# Patient Record
Sex: Female | Born: 1975 | Race: Black or African American | Hispanic: No | Marital: Married | Smoking: Former smoker
Health system: Southern US, Community
[De-identification: ages and names within clinical notes are randomized; demographics above are authoritative.]

## PROBLEM LIST (undated history)

## (undated) DIAGNOSIS — D219 Benign neoplasm of connective and other soft tissue, unspecified: Secondary | ICD-10-CM

## (undated) DIAGNOSIS — N946 Dysmenorrhea, unspecified: Secondary | ICD-10-CM

## (undated) DIAGNOSIS — E049 Nontoxic goiter, unspecified: Secondary | ICD-10-CM

## (undated) DIAGNOSIS — F419 Anxiety disorder, unspecified: Secondary | ICD-10-CM

## (undated) DIAGNOSIS — E349 Endocrine disorder, unspecified: Secondary | ICD-10-CM

## (undated) DIAGNOSIS — C73 Malignant neoplasm of thyroid gland: Secondary | ICD-10-CM

## (undated) DIAGNOSIS — F329 Major depressive disorder, single episode, unspecified: Secondary | ICD-10-CM

## (undated) DIAGNOSIS — N809 Endometriosis, unspecified: Secondary | ICD-10-CM

## (undated) HISTORY — DX: Anxiety disorder, unspecified: F41.9

## (undated) HISTORY — PX: THYROIDECTOMY: SHX17

## (undated) HISTORY — DX: Nontoxic goiter, unspecified: E04.9

## (undated) HISTORY — DX: Endometriosis, unspecified: N80.9

## (undated) HISTORY — DX: Malignant neoplasm of thyroid gland: C73

## (undated) HISTORY — DX: Endocrine disorder, unspecified: E34.9

## (undated) HISTORY — DX: Dysmenorrhea, unspecified: N94.6

## (undated) HISTORY — DX: Major depressive disorder, single episode, unspecified: F32.9

## (undated) HISTORY — DX: Benign neoplasm of connective and other soft tissue, unspecified: D21.9

---
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
## 1998-06-23 ENCOUNTER — Other Ambulatory Visit: Admission: RE | Admit: 1998-06-23 | Discharge: 1998-06-23 | Payer: Self-pay | Admitting: Obstetrics and Gynecology

## 1998-12-25 ENCOUNTER — Encounter: Admission: RE | Admit: 1998-12-25 | Discharge: 1998-12-25 | Payer: Self-pay | Admitting: Obstetrics and Gynecology

## 1999-03-15 HISTORY — PX: TUBAL LIGATION: SHX77

## 1999-03-22 ENCOUNTER — Ambulatory Visit (HOSPITAL_COMMUNITY): Admission: RE | Admit: 1999-03-22 | Discharge: 1999-03-22 | Payer: Self-pay | Admitting: Obstetrics and Gynecology

## 1999-03-22 ENCOUNTER — Encounter: Payer: Self-pay | Admitting: Obstetrics and Gynecology

## 1999-04-19 ENCOUNTER — Ambulatory Visit (HOSPITAL_COMMUNITY): Admission: RE | Admit: 1999-04-19 | Discharge: 1999-04-19 | Payer: Self-pay | Admitting: Psychology

## 1999-04-19 ENCOUNTER — Encounter: Payer: Self-pay | Admitting: Obstetrics and Gynecology

## 1999-05-13 ENCOUNTER — Other Ambulatory Visit: Admission: RE | Admit: 1999-05-13 | Discharge: 1999-05-13 | Payer: Self-pay | Admitting: Obstetrics and Gynecology

## 1999-08-13 ENCOUNTER — Encounter (INDEPENDENT_AMBULATORY_CARE_PROVIDER_SITE_OTHER): Payer: Self-pay | Admitting: Specialist

## 1999-08-13 ENCOUNTER — Inpatient Hospital Stay (HOSPITAL_COMMUNITY): Admission: AD | Admit: 1999-08-13 | Discharge: 1999-08-15 | Payer: Self-pay | Admitting: Obstetrics and Gynecology

## 2002-05-17 ENCOUNTER — Other Ambulatory Visit: Admission: RE | Admit: 2002-05-17 | Discharge: 2002-05-17 | Payer: Self-pay | Admitting: Obstetrics and Gynecology

## 2004-12-01 ENCOUNTER — Other Ambulatory Visit: Admission: RE | Admit: 2004-12-01 | Discharge: 2004-12-01 | Payer: Self-pay | Admitting: Obstetrics and Gynecology

## 2006-03-14 DIAGNOSIS — D219 Benign neoplasm of connective and other soft tissue, unspecified: Secondary | ICD-10-CM

## 2006-03-14 HISTORY — DX: Benign neoplasm of connective and other soft tissue, unspecified: D21.9

## 2006-07-25 ENCOUNTER — Ambulatory Visit: Payer: Self-pay | Admitting: Internal Medicine

## 2006-07-27 ENCOUNTER — Ambulatory Visit: Payer: Self-pay | Admitting: Internal Medicine

## 2006-08-04 ENCOUNTER — Ambulatory Visit: Payer: Self-pay | Admitting: Internal Medicine

## 2007-03-15 HISTORY — PX: ABLATION: SHX5711

## 2007-07-09 ENCOUNTER — Emergency Department (HOSPITAL_COMMUNITY): Admission: EM | Admit: 2007-07-09 | Discharge: 2007-07-09 | Payer: Self-pay | Admitting: Emergency Medicine

## 2007-07-11 ENCOUNTER — Ambulatory Visit: Payer: Self-pay | Admitting: Internal Medicine

## 2007-07-11 DIAGNOSIS — R519 Headache, unspecified: Secondary | ICD-10-CM | POA: Insufficient documentation

## 2007-07-11 DIAGNOSIS — R51 Headache: Secondary | ICD-10-CM | POA: Insufficient documentation

## 2007-11-02 ENCOUNTER — Ambulatory Visit: Payer: Self-pay | Admitting: Internal Medicine

## 2009-04-30 ENCOUNTER — Ambulatory Visit (HOSPITAL_COMMUNITY): Admission: RE | Admit: 2009-04-30 | Discharge: 2009-04-30 | Payer: Self-pay | Admitting: Obstetrics and Gynecology

## 2010-06-04 LAB — CBC
HCT: 38.4 % (ref 36.0–46.0)
Hemoglobin: 12.5 g/dL (ref 12.0–15.0)
MCHC: 32.6 g/dL (ref 30.0–36.0)
MCV: 84.8 fL (ref 78.0–100.0)
Platelets: 280 10*3/uL (ref 150–400)
RBC: 4.53 MIL/uL (ref 3.87–5.11)
RDW: 13.5 % (ref 11.5–15.5)
WBC: 5.2 10*3/uL (ref 4.0–10.5)

## 2010-06-04 LAB — PREGNANCY, URINE: Preg Test, Ur: NEGATIVE

## 2010-07-27 NOTE — Assessment & Plan Note (Signed)
[facility] HEALTHCARE                        [place] OFFICE NOTE   TAMRA, KOOS                          MRN:          [id number]  DATE:07/27/2006                            DOB:          [birth year]    Ms. Ludden was seen Jul 27, 2006 for evaluation of fever and headache  associated with some dizziness.   She had a physical examination on April 13. At that appointment, she had  an Adacel immunization (tetanus/diphtheria). That night, she had nausea  and a frontal headache. She treated it with ibuprofen and felt somewhat  better. On April 14, fever was noted to be as high as 103. Treated with  ibuprofen and did resolve the fever, but recurred today, prompting her  to be seen.   She has had some associated diarrhea; she denies any purulence from her  eyes, nose or chest. She has had no dysuria or pyuria.   Remotely, she was stung by a bee and did have asthmatic, ocular,  auricular swelling.   At this time, the headache is mild.   PHYSICAL EXAMINATION:  She is in no acute distress. Her temperature is  97.5, pulse 80, respiratory rate 15, blood pressure 108/70.  She has no scleral icterus. There is a 5 x 4.5 cm erythematous, slightly  tender, and warm lesion at the site of the immunization. She has no  axillary or cervical lymphadenopathy.   By history, she states the entire right upper extremity was swollen; she  describes pain with range of motion. She denied any pruritus.   She states that the present margins are dramatically better. She  describes erythema was involving the upper right arm.   The erythema and fever raises the question of possible superimposed  bacterial infection. The dramatic swelling would be worrisome for a  possible allergic reaction.   At this time, I will ask her to use Tylenol or ibuprofen if she has any  fever and take this every 4 hours as needed for fever. She should have 8  ounces of fluid every hour while  awake. If the fever persists, then  cephalexin will be filled. She will be asked to monitor the size of the  erythematous reaction by outlining it with a pen and outlining it each  day after showering. Cool compresses locally would be recommended.   She should report any extension of the redness, high fevers or  progression of pain.   By history, the swelling and pain and redness occurred several hours  after the immunization. In the future, any immunizations be administered  with an observation period of 1 or 2 hours if possible.     Titus Dubin. Alwyn Ren, MD,FACP,FCCP     [facility]/MedQ  DD: 07/27/2006  DT: 07/27/2006  Job #: [id number]

## 2010-07-30 NOTE — H&P (Signed)
[place]  Patient:    [name], [name]                          MRN: [id number] Adm. Date:  [id number] Disc. Date: [id number] Attending:  Oliver Williams                         History and Physical  HISTORY OF PRESENT ILLNESS:  This 35 year old black female, GIII, PI/0/I/I, with EDC of 08/22/99, presented to Labor & Delivery complaining of contractions every three to six minutes for two hours; cervix 3 cm and 70% with vertex -2 in triage, with change to 4 cm, 70 and -2.  Blood type/group O positive with negative antibody, sickle cell negative and RPR negative, rubella immune and hepatitis-B surface antigen negative.  HIV negative. GC and Chlamydia negative.  Group B Strep positive.  After one hour of walking, the cervix had changed; she was admitted to the hospital.  She had no ruptured membranes, no vaginal bleeding, good fetal movement. Pregnancy complicated by positive Group B Strep.  PAST OBSTETRIC HISTORY:  In 1997, she had a spontaneous vaginal delivery of a 6 lb 13 oz infant.  In 1997, therapeutic abortion.  ALLERGIES:  None.  PAST GYN HISTORY:  Negative.  PAST MEDICAL HISTORY:  Negative.  PAST SURGICAL HISTORY:  Negative.  HABITS:  No ethyl alcohol, tobacco or drugs.  PHYSICAL EXAMINATION:  VITAL SIGNS:  Normal; the patient was afebrile.  HEART:  Normal size and sounds; no murmurs.  LUNGS:  Clear to P&A.  ABDOMEN:  Gravid and nontender.  Cervix 4 cm, 70%  The patient was placed on penicillin.  She ultimately received an epidural, made poor progress and was placed on Pitocin and progressed to full dilatation and delivered a living female infant, 7 lb 4 oz.  ADMITTING IMPRESSION:  Intrauterine pregnancy, delivered at 38+ weeks.  The patient requests voluntary sterilization.  She and her husband were counseled extensively about the disadvantages of tubal  ligation immediately postpartum in a woman 35 years old, but  she requests to proceed, so the surgery has been scheduled for the day after delivery. DD:  08/16/99 TD:  08/16/99 Job: [id number]

## 2010-07-30 NOTE — Discharge Summary (Signed)
[facility]  Patient:    [name], [name]                          MRN: [id number] Adm. Date:  [id number] Disc. Date: [id number] Attending:  Oliver Pila                           Discharge Summary  HOSPITAL COURSE:              [age]-year-old black married female, para 1-0-1-1, gravida 3, 38+ weeks, with EDC of August 22, 1999, admitted to the hospital in early labor.  Blood group and type:  O-positive with a negative antibody.  Sickle cell negative.  RPR negative.  Rubella immune.  Hepatitis B surface antigen and HIV negative.  GC and Chlamydia negative.  Group B strep positive.  The patient progressed in labor and delivered a living female infant, 7 pounds 4 ounces, over an intact perineum by Dr. Malachi Pro. Henley.  The patient requested tubal ligation.  I counseled her extensively and also her husband but they wanted to proceed.  She underwent a tubal ligation on August 14, 1999, did well postoperatively, and is ready for discharge on the second postpartum and first postop days.  LABORATORY DATA:              WBC was 8500 on admission; 11,600 postpartum. Hemoglobin was 11.3, hematocrit 32.9 and platelet count 228,000 on August 13, 1999.  Hemoglobin 9.8, hematocrit 29.2 and platelet count 195,000 on August 14, 1999.  RPR was nonreactive.  FINAL DIAGNOSES:              1. Intrauterine pregnancy at 38+ weeks,                                  delivered vertex.                               2. Voluntary sterilization.  OPERATION:                    Spontaneous delivery, vertex; bilateral tubal ligation.  FINAL CONDITION:              Improved.  DISCHARGE INSTRUCTIONS:       Instructions include our regular discharge instruction booklet.  She is asked to return in two weeks, call with any problems, take Motrin 800 mg three times a day as needed for pain. DD:  08/15/99 TD:  08/19/99 Job: [id number]

## 2010-07-30 NOTE — Op Note (Signed)
[place] [facility]  Patient:    [name], [name]                          MRN: [id number] Proc. Date: 08/14/99 Adm. Date:  [id number] Disc. Date: [id number] Attending:  Oliver Pila                           Operative Report  PREOPERATIVE DIAGNOSIS:       Voluntary sterilization.  POSTOPERATIVE DIAGNOSIS:      Voluntary sterilization.  OPERATION:                    Bilateral tubal ligation.  SURGEON:                      Malachi Pro. Ambrose Mantle, M.D.  ANESTHESIA:                   Epidural.  DESCRIPTION OF PROCEDURE:     The patient was brought to the operating room after extensive counseling about the tubal procedure.  She was given satisfactory epidural anesthesia.  The abdomen was prepped with Betadine solution and draped as a sterile field.  A semilunar incision was made in the inferior portion of the umbilicus and carried in layers through the skin and subcutaneous tissue to the fascia.  The fascia was elevated with Kocher clamps.  It was incised with a knife.  The peritoneum was opened and each tube was identified and traced to its fimbriated end.  Neither ovary was seen, although they felt normal.  I created a window in the mesosalpinx in the midportion of each fallopian tube.  I placed two ties proximally and distally on each tube, and then excised the portion of tube in between.  Hemostasis was complete.  No complications were encountered.  The portion of tube from each side was excised and sent to pathology for evaluation.  Both fimbriated ends looked normal.  The abdominal wall was closed with two interrupted figure-of-eight sutures of 0 Vicryl in the fascia, interrupted 3-0 Vicryl in the subcutaneous tissue, and 3-0 plain catgut on the skin.                                The patient seemed to tolerate the procedure well.  Blood loss was less than 5 cc.  She was returned to the recovery room in satisfactory condition. DD:  08/14/99 TD:   08/16/99 Job: [id number]

## 2011-12-01 ENCOUNTER — Ambulatory Visit: Payer: Managed Care, Other (non HMO) | Admitting: Internal Medicine

## 2011-12-01 ENCOUNTER — Encounter: Payer: Self-pay | Admitting: Internal Medicine

## 2011-12-01 VITALS — BP 128/76 | HR 92 | Temp 98.4°F | Resp 17 | Ht 63.0 in | Wt 220.0 lb

## 2011-12-01 DIAGNOSIS — N390 Urinary tract infection, site not specified: Secondary | ICD-10-CM

## 2011-12-01 DIAGNOSIS — R300 Dysuria: Secondary | ICD-10-CM

## 2011-12-01 LAB — POCT URINALYSIS DIPSTICK
Bilirubin, UA: NEGATIVE
Blood, UA: NEGATIVE
Glucose, UA: NEGATIVE
Nitrite, UA: POSITIVE
Spec Grav, UA: 1.025
Urobilinogen, UA: 0.2
pH, UA: 5.5

## 2011-12-01 LAB — POCT UA - MICROSCOPIC ONLY
Casts, Ur, LPF, POC: NEGATIVE
Crystals, Ur, HPF, POC: NEGATIVE
Yeast, UA: NEGATIVE

## 2011-12-01 MED ORDER — CIPROFLOXACIN HCL 500 MG PO TABS: 500.0000 mg | ORAL_TABLET | Freq: Two times a day (BID) | ORAL | Status: DC

## 2011-12-01 NOTE — Patient Instructions (Addendum)
Your medication has been sent into the pharmacy.  It is called Cipro.  Take it twice a day with food for 3 days.  If you have any fevers, chills, nausea or vomiting, or back pain let us know.    Urinary Tract Infection Infections of the urinary tract can start in several places. A bladder infection (cystitis), a kidney infection (pyelonephritis), and a prostate infection (prostatitis) are different types of urinary tract infections (UTIs). They usually get better if treated with medicines (antibiotics) that kill germs. Take all the medicine until it is gone. You or your child may feel better in a few days, but TAKE ALL MEDICINE or the infection may not respond and may become more difficult to treat. HOME CARE INSTRUCTIONS   Drink enough water and fluids to keep the urine clear or pale yellow. Cranberry juice is especially recommended, in addition to large amounts of water.   Avoid caffeine, tea, and carbonated beverages. They tend to irritate the bladder.   Alcohol may irritate the prostate.   Only take over-the-counter or prescription medicines for pain, discomfort, or fever as directed by your caregiver.  To prevent further infections:  Empty the bladder often. Avoid holding urine for long periods of time.   After a bowel movement, women should cleanse from front to back. Use each tissue only once.   Empty the bladder before and after sexual intercourse.  FINDING OUT THE RESULTS OF YOUR TEST Not all test results are available during your visit. If your or your child's test results are not back during the visit, make an appointment with your caregiver to find out the results. Do not assume everything is normal if you have not heard from your caregiver or the medical facility. It is important for you to follow up on all test results. SEEK MEDICAL CARE IF:   There is back pain.   Your baby is older than 3 months with a rectal temperature of 100.5 F (38.1 C) or higher for more than 1  day.   Your or your child's problems (symptoms) are no better in 3 days. Return sooner if you or your child is getting worse.  SEEK IMMEDIATE MEDICAL CARE IF:   There is severe back pain or lower abdominal pain.   You or your child develops chills.   You have a fever.   Your baby is older than 3 months with a rectal temperature of 102 F (38.9 C) or higher.   Your baby is 23 months old or younger with a rectal temperature of 100.4 F (38 C) or higher.   There is nausea or vomiting.   There is continued burning or discomfort with urination.  MAKE SURE YOU:   Understand these instructions.   Will watch your condition.   Will get help right away if you are not doing well or get worse.  Document Released: 12/08/2004 Document Revised: 02/17/2011 Document Reviewed: 07/13/2006 [facility] Patient Information 2012 [place], [place].

## 2011-12-01 NOTE — Addendum Note (Signed)
Addended byGwendolyn Grant, Newt Lukes on: 12/01/2011 07:12 PM   Modules accepted: Level of Service

## 2011-12-01 NOTE — Progress Notes (Signed)
Patient ID: [name], female   DOB: [birth year], 36 y.o.   MRN: [id number] [name] is a 35 y.o. female who presents to Urgent Care today for dysuria and urinary frequency:  1 .  UTI symptoms:  Present for 2 weeks.  She has been having burning sensation when she urinates.  Also having suprapubic pressure and increased frequency for the same period of time.  No nausea or vomiting, no fevers or chills.  No back pain.     PMH reviewed.  ROS as above otherwise neg.  No chest pain, palpitations, SOB, Fever, Chills, Abd pain, N/V/D.  Medications reviewed. Current Outpatient Prescriptions  Medication Sig Dispense Refill  . ciprofloxacin (CIPRO) 500 MG tablet Take 1 tablet (500 mg total) by mouth 2 (two) times daily.  6 tablet  0    Exam:  BP 128/76  Pulse 92  Temp 98.4 F (36.9 C) (Oral)  Resp 17  Ht 5\' 3"  (1.6 m)  Wt 220 lb (99.791 kg)  BMI 38.97 kg/m2  SpO2 100% Gen: Well NAD Cardiac:  Regular rate and rhythm without murmur auscultated.  Good S1/S2. Pulm:  Clear to auscultation bilaterally with good air movement.  No wheezes or rales noted.   Abd:  Soft/nondistended/nontender.  Good bowel sounds throughout all four quadrants.  No masses noted.  No CVA tenderness  Results for orders placed in visit on 12/01/11  POCT UA - MICROSCOPIC ONLY      Component Value Range   WBC, Ur, HPF, POC 10-20     RBC, urine, microscopic 0-2     Bacteria, U Microscopic 2+     Mucus, UA 1+     Epithelial cells, urine per micros 8-12     Crystals, Ur, HPF, POC neg     Casts, Ur, LPF, POC neg     Yeast, UA neg    POCT URINALYSIS DIPSTICK      Component Value Range   Color, UA yellow     Clarity, UA cloudy     Glucose, UA neg     Bilirubin, UA neg     Ketones, UA tr     Spec Grav, UA 1.025     Blood, UA neg     pH, UA 5.5     Protein, UA tr     Urobilinogen, UA 0.2     Nitrite, UA pos     Leukocytes, UA small (1+)       Assessment and Plan:  1.  UTI:  Diagnosis by history and  laboratory.  Cipro x 3 days.  No red flag, provided reasons to return to clinic or that would signify systemic symptoms.  Patient expressed understanding.  Will send urine culture.    Tobey Grim, MD

## 2011-12-01 NOTE — Addendum Note (Signed)
Addended by: Rudell Cobb on: 12/01/2011 06:13 PM   Modules accepted: Orders

## 2011-12-01 NOTE — Progress Notes (Signed)
  Subjective:    Patient ID: [name], female    DOB: 03-Jun-1975, 36 y.o.   MRN: [id number]  HPI    Review of Systems     Objective:   Physical Exam        Assessment & Plan:  No where to be found

## 2011-12-01 NOTE — Addendum Note (Signed)
Addended byGwendolyn Grant, Myonna Chisom H on: 12/01/2011 07:06 PM   Modules accepted: Orders

## 2012-07-03 ENCOUNTER — Encounter (HOSPITAL_COMMUNITY): Payer: Self-pay | Admitting: Emergency Medicine

## 2012-07-03 ENCOUNTER — Emergency Department (HOSPITAL_COMMUNITY)
Admission: EM | Admit: 2012-07-03 | Discharge: 2012-07-03 | Disposition: A | Discharge status: Home / Self Care | Payer: Managed Care, Other (non HMO) | Source: Home / Self Care

## 2012-07-03 DIAGNOSIS — L03317 Cellulitis of buttock: Secondary | ICD-10-CM

## 2012-07-03 DIAGNOSIS — L0231 Cutaneous abscess of buttock: Secondary | ICD-10-CM

## 2012-07-03 MED ORDER — SULFAMETHOXAZOLE-TRIMETHOPRIM 800-160 MG PO TABS: 1.0000 | ORAL_TABLET | Freq: Two times a day (BID) | ORAL | Status: DC

## 2012-07-03 MED ORDER — HYDROCODONE-ACETAMINOPHEN 7.5-325 MG PO TABS: 1.0000 | ORAL_TABLET | ORAL | Status: DC | PRN

## 2012-07-03 NOTE — ED Notes (Signed)
Pt reports boil on left buttock x 2 wks denies drainage  Painful to touch. Pt has used warm compresses with no relief in symptoms.   Hx of recurrent boils.

## 2012-07-03 NOTE — ED Provider Notes (Signed)
History     CSN: [id number]  Arrival date & time 07/03/12  1712   None     Chief Complaint  Patient presents with  . Recurrent Skin Infections    boil on left buttock x 2 wks. denies drainage. gradually getting worse. hx of recurrent boils    (Consider location/radiation/quality/duration/timing/severity/associated sxs/prior treatment) HPI Comments: 37 year old female presents with a "boil" on the left buttock that started approximately 2 weeks ago. She began to apply warm compresses that it would dissipate as her previous ones have. She is now complaining of pain in the medial left buttock near the gluteal cleft. Denies systemic or constitutional symptoms.   History reviewed. No pertinent past medical history.  Past Surgical History  Procedure Laterality Date  . Tubal ligation      History reviewed. No pertinent family history.  History  Substance Use Topics  . Smoking status: Never Smoker   . Smokeless tobacco: Not on file  . Alcohol Use: Yes    OB History   Grav Para Term Preterm Abortions TAB SAB Ect Mult Living                  Review of Systems  Skin: Negative for rash.       Abscess as described in history of present illness.  All other systems reviewed and are negative.    Allergies  Review of patient's allergies indicates no known allergies.  Home Medications   Current Outpatient Rx  Name  Route  Sig  Dispense  Refill  . ciprofloxacin (CIPRO) 500 MG tablet   Oral   Take 1 tablet (500 mg total) by mouth 2 (two) times daily.   6 tablet   0   . HYDROcodone-acetaminophen (NORCO) 7.5-325 MG per tablet   Oral   Take 1 tablet by mouth every 4 (four) hours as needed for pain.   15 tablet   0   . sulfamethoxazole-trimethoprim (SEPTRA DS) 800-160 MG per tablet   Oral   Take 1 tablet by mouth 2 (two) times daily. X 7 days   14 tablet   0     BP 123/60  Pulse 102  Temp(Src) 99.3 F (37.4 C) (Oral)  Resp 24  SpO2 100%  Physical Exam   Nursing note and vitals reviewed. Constitutional: She is oriented to person, place, and time. She appears well-developed. No distress.  Morbidly obese  Pulmonary/Chest: Effort normal.  Musculoskeletal: She exhibits no edema.  Neurological: She is alert and oriented to person, place, and time. She exhibits normal muscle tone.  Skin: Skin is warm and dry.  Abscess with underlying induration approximately 5 cm in length located in the left buttock within the gluteal cleft. It does not cross the the midsagittal line of the cleft. In the presence of Marchella,MA the buttocks are separated and at that time a large amount of purulent material self expressed from the wound. 4 x 4's were placed to catch the drainage. No other lesions observed. Pain is not affected. No lymphangitis or cellulitis.  Psychiatric: She has a normal mood and affect.    ED Course  INCISION AND DRAINAGE Date/Time: 07/03/2012 7:00 PM Performed by: Phineas Real, Jeannetta Cerutti Authorized by: Phineas Real, Gertrue Willette Consent: Verbal consent obtained. 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 No incision had to be made. The purulence was expressed manually  and collected on 4 x 4's discarded.  once the opening stop draining a  thick dressing was applied.    (including critical care time)  Labs Reviewed  CULTURE, ROUTINE-ABSCESS   No results found.   1. Abscess of buttock, left       MDM  Warm sitz bath when you get home tonight and is often during the day if possible. Warm compresses and  allow it to drain as much as possible. Wash hands frequently and any material to come in contact with the wound. Allow it to drain as long as possible. Septra DS one twice a day for 7 days Norco 7.5 one every 4 hours when necessary pain #15 If in 2 days the wound is not improving or getting worse return promptly. Wound culture was obtained.  Hayden Rasmussen, NP 07/03/12 1927

## 2012-07-05 NOTE — ED Provider Notes (Signed)
Medical screening examination/treatment/procedure(s) were performed by non-physician practitioner and as supervising physician I was immediately available for consultation/collaboration.   MORENO-COLL,Shadara Lopez; MD  Steffan Caniglia Moreno-Coll, MD 07/05/12 0923 

## 2012-07-08 LAB — CULTURE, ROUTINE-ABSCESS

## 2012-08-03 ENCOUNTER — Encounter: Payer: Self-pay | Admitting: Gynecology

## 2012-08-03 ENCOUNTER — Ambulatory Visit (INDEPENDENT_AMBULATORY_CARE_PROVIDER_SITE_OTHER): Payer: Managed Care, Other (non HMO) | Admitting: Gynecology

## 2012-08-03 VITALS — BP 124/72 | Ht 62.5 in | Wt 244.0 lb

## 2012-08-03 DIAGNOSIS — F329 Major depressive disorder, single episode, unspecified: Secondary | ICD-10-CM

## 2012-08-03 DIAGNOSIS — C73 Malignant neoplasm of thyroid gland: Secondary | ICD-10-CM | POA: Insufficient documentation

## 2012-08-03 DIAGNOSIS — Z0000 Encounter for general adult medical examination without abnormal findings: Secondary | ICD-10-CM

## 2012-08-03 DIAGNOSIS — F341 Dysthymic disorder: Secondary | ICD-10-CM

## 2012-08-03 DIAGNOSIS — Z124 Encounter for screening for malignant neoplasm of cervix: Secondary | ICD-10-CM

## 2012-08-03 DIAGNOSIS — E049 Nontoxic goiter, unspecified: Secondary | ICD-10-CM

## 2012-08-03 DIAGNOSIS — Z01419 Encounter for gynecological examination (general) (routine) without abnormal findings: Secondary | ICD-10-CM

## 2012-08-03 HISTORY — DX: Nontoxic goiter, unspecified: E04.9

## 2012-08-03 LAB — CBC
MCHC: 33.3 g/dL (ref 30.0–36.0)
Platelets: 237 10*3/uL (ref 150–400)
RDW: 14.8 % (ref 11.5–15.5)
WBC: 5.5 10*3/uL (ref 4.0–10.5)

## 2012-08-03 LAB — POCT URINALYSIS DIPSTICK: pH, UA: 5

## 2012-08-03 LAB — TSH: TSH: 3.147 u[IU]/mL (ref 0.350–4.500)

## 2012-08-03 LAB — VITAMIN B12: Vitamin B-12: 511 pg/mL (ref 211–911)

## 2012-08-03 NOTE — Progress Notes (Signed)
37 y.o.   Married    [place]   female   [phone]   here for annual exam.  Pt reports history of menorrhagia treated with endometrial ablation in 07/28/2007, no bleeding since but feels like her" hormones are off".  Pt reports having cold intolerance and occaisional hot flushes, also reports moodiness-highs and lows.  Affects interactions with work and family.  Pt lost 40# last year-diet and exercise and has gained 15# after stopping rx.     Pt reports that her sister died Jul 28, 2006 from cervical cancer and is still emotional about it.  Patient's last menstrual period was 03/15/2007.          Sexually active: yes  The current method of family planning is tubal ligation.    Exercising: cardio3x/wk Last mammogram:  none Last pap smear: 2010/07/28 History of abnormal pap: yes, had colposcopy normal ever since Smoking: no Alcohol: 2 drinks/wk. Glass of wine Last colonoscopy: none Last Bone Density:  none Last tetanus shot: not sure Last cholesterol check: 2011-07-28- Slightly Elevated, lost weight normal  Hgb:                Urine: Leuks 2   Family History  Problem Relation Age of Onset  . Cervical cancer Mother   . Diabetes Mother   . Cervical cancer Sister   . Thyroid disease Maternal Aunt   . Lung cancer Maternal Grandfather     Patient Active Problem List   Diagnosis Date Noted  . HEADACHE 07/11/2007    Past Medical History  Diagnosis Date  . Endometriosis   . Anxiety   . Dysmenorrhea     in the past  . Fibroid Jul 28, 2006    small  . Hormone disorder     Past Surgical History  Procedure Laterality Date  . Ablation  07/28/07  . Tubal ligation  Jul 28, 1999  . Gynecologic cryosurgery  1995/07/28    Allergies: Hydrocodone  Current Outpatient Prescriptions  Medication Sig Dispense Refill  . Multiple Vitamins-Minerals (MULTIVITAMIN PO) Take by mouth.       No current facility-administered medications for this visit.    ROS: Pertinent items are noted in HPI.  Social Hx:    Exam:    BP 124/72   Ht 5' 2.5" (1.588 m)  Wt 244 lb (110.678 kg)  BMI 43.89 kg/m2  LMP 03/15/2007   Wt Readings from Last 3 Encounters:  08/03/12 244 lb (110.678 kg)  12/01/11 220 lb (99.791 kg)  07/11/07 245 lb 3.2 oz (111.222 kg)     Ht Readings from Last 3 Encounters:  08/03/12 5' 2.5" (1.588 m)  12/01/11 5\' 3"  (1.6 m)    General appearance: alert, cooperative and appears stated age Head: Normocephalic, without obvious abnormality, atraumatic Neck: no adenopathy, supple, symmetrical, trachea midline  thyroid enlarged, asymmetric displaced to right, no tenderness/mass/nodules felt Lungs: clear to auscultation bilaterally Breasts: Inspection negative, No nipple retraction or dimpling, No nipple discharge or bleeding, No axillary or supraclavicular adenopathy, Normal to palpation without dominant masses Heart: regular rate and rhythm Abdomen: soft, non-tender; bowel sounds normal; no masses,  no organomegaly Extremities: extremities normal, atraumatic, no cyanosis or edema Skin: Skin color, texture, turgor normal. No rashes or lesions Lymph nodes: Cervical, supraclavicular, and axillary nodes normal. No abnormal inguinal nodes palpated Neurologic: Grossly normal   Pelvic: External genitalia:  no lesions              Urethra:  normal appearing urethra with no masses, tenderness or lesions  Bartholins and Skenes: normal                 Vagina: normal appearing vagina with normal color and discharge, no lesions              Cervix: normal appearance              Pap taken: yes        Bimanual Exam:  Uterus:  uterus is normal size, shape, consistency and nontender                                      Adnexa: normal adnexa in size, nontender and no masses                                      Rectovaginal: Confirms                                      Anus:  normal sphincter tone, no lesions  A: normal well women depression  Enlarged thyroid     P: had long discussion regarding her  need to accept the death of her sister, encourage pt to discuss with her minister or we can refer to counseling. 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  We suggested trial of antidepressants, she had used prozac in past but felt flat, we suggested effexor.   We will check B12,Vit D to rule out other sources of depression TSH and thryoid u/s triage accordingly  mammogram at 41, BSE stressed pap smear guidelines reviewed return annually or prn     An After Visit Summary was printed and given to the patient.

## 2012-08-03 NOTE — Addendum Note (Signed)
Addended by: Douglass Rivers on: 08/03/2012 08:20 AM   Modules accepted: Orders

## 2012-08-04 LAB — VITAMIN D 25 HYDROXY (VIT D DEFICIENCY, FRACTURES): Vit D, 25-Hydroxy: 29 ng/mL — ABNORMAL LOW (ref 30–89)

## 2012-08-07 ENCOUNTER — Telehealth: Payer: Self-pay | Admitting: *Deleted

## 2012-08-07 NOTE — Telephone Encounter (Signed)
Left Message To Call Back  

## 2012-08-07 NOTE — Telephone Encounter (Signed)
Message copied by Lorraine Lax on Tue Aug 07, 2012  1:47 PM ------      Message from: Douglass Rivers      Created: Tue Aug 07, 2012  8:54 AM       Vit d protocol, labs normal ------

## 2012-08-07 NOTE — Telephone Encounter (Signed)
Pt notified of results. (see labs)

## 2012-08-08 ENCOUNTER — Telehealth: Payer: Self-pay | Admitting: Orthopedic Surgery

## 2012-08-08 LAB — IPS PAP TEST WITH HPV

## 2012-08-08 NOTE — Telephone Encounter (Signed)
LM on pt's VM confirming name about appt at [facility] tomorrow 08-09-12 at 1 pm for thyroid US. [address] location. No special prep instructions. Phone number given to reschedule.

## 2012-08-09 ENCOUNTER — Other Ambulatory Visit: Payer: Managed Care, Other (non HMO)

## 2012-08-09 ENCOUNTER — Telehealth: Payer: Self-pay | Admitting: *Deleted

## 2012-08-09 NOTE — Telephone Encounter (Signed)
Message copied by Lorraine Lax on Thu Aug 09, 2012  8:56 AM ------      Message from: Douglass Rivers      Created: Wed Aug 08, 2012  6:23 PM       Inform +BV, no rx necessary unless sx-metrogel 1 applicaor at bedtime for 5 nights, or flagyl 500mg  BID #[id number] ------

## 2012-08-09 NOTE — Telephone Encounter (Signed)
Left Message To Call Back  

## 2012-08-09 NOTE — Telephone Encounter (Signed)
Pt notified (see labs)

## 2012-08-10 ENCOUNTER — Encounter: Payer: Self-pay | Admitting: Gynecology

## 2012-08-10 ENCOUNTER — Ambulatory Visit (INDEPENDENT_AMBULATORY_CARE_PROVIDER_SITE_OTHER): Payer: Managed Care, Other (non HMO) | Admitting: Gynecology

## 2012-08-10 VITALS — BP 116/72

## 2012-08-10 DIAGNOSIS — A499 Bacterial infection, unspecified: Secondary | ICD-10-CM

## 2012-08-10 DIAGNOSIS — B9689 Other specified bacterial agents as the cause of diseases classified elsewhere: Secondary | ICD-10-CM

## 2012-08-10 DIAGNOSIS — L732 Hidradenitis suppurativa: Secondary | ICD-10-CM

## 2012-08-10 DIAGNOSIS — N760 Acute vaginitis: Secondary | ICD-10-CM

## 2012-08-10 DIAGNOSIS — F3289 Other specified depressive episodes: Secondary | ICD-10-CM

## 2012-08-10 DIAGNOSIS — F32A Depression, unspecified: Secondary | ICD-10-CM

## 2012-08-10 DIAGNOSIS — F329 Major depressive disorder, single episode, unspecified: Secondary | ICD-10-CM

## 2012-08-10 HISTORY — DX: Depression, unspecified: F32.A

## 2012-08-10 MED ORDER — VENLAFAXINE HCL ER 37.5 MG PO CP24: ORAL_CAPSULE | ORAL | Status: DC

## 2012-08-10 NOTE — Patient Instructions (Signed)
Call with dose adjustments

## 2012-08-10 NOTE — Progress Notes (Signed)
Pt here to discuss a few issues.  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  Pt states that these are usually treated with antibiotics but has had them removed as well.  She currently does not have any at this time for [place] to see.  Lastly  At our last visit we had discussed trying Effexor for her mild depression, she had tried prozac in the past but felt it made her feel flat. We discussed at length bacterial vaginosis and hidranditis suppurativa. They are not associated with each other, we discussed the typical places for HS to ocur ant the typical treatments, but that none are difinitive.  We suggest she keep the area dry and avoid excessive rubbing of the area, she should return to the office if she has another one recur.  As she is not symptomatic from the BV, it does not require treatment, lastly, she requests trial of effexor, we will start her on 37.5 and she was instructed to increase to 75mg  after 2w if needed, she will call so we can adjust her dose accordingly, we'd like to see how she is doing after 20m once her does has been set, we will make that appt accordingly Length of time in counselling 104m >50% face to face

## 2012-08-13 ENCOUNTER — Telehealth: Payer: Self-pay | Admitting: Gynecology

## 2012-08-13 ENCOUNTER — Ambulatory Visit
Admission: RE | Admit: 2012-08-13 | Discharge: 2012-08-13 | Disposition: A | Discharge status: Home / Self Care | Payer: Managed Care, Other (non HMO) | Source: Ambulatory Visit | Attending: Gynecology | Admitting: Gynecology

## 2012-08-13 DIAGNOSIS — E049 Nontoxic goiter, unspecified: Secondary | ICD-10-CM

## 2012-08-14 ENCOUNTER — Other Ambulatory Visit: Payer: Self-pay | Admitting: Gynecology

## 2012-08-14 DIAGNOSIS — E041 Nontoxic single thyroid nodule: Secondary | ICD-10-CM

## 2012-08-22 ENCOUNTER — Encounter: Payer: Self-pay | Admitting: Gynecology

## 2012-08-27 ENCOUNTER — Telehealth (INDEPENDENT_AMBULATORY_CARE_PROVIDER_SITE_OTHER): Payer: Self-pay | Admitting: General Surgery

## 2012-08-27 ENCOUNTER — Encounter (INDEPENDENT_AMBULATORY_CARE_PROVIDER_SITE_OTHER): Payer: Self-pay | Admitting: Surgery

## 2012-08-27 ENCOUNTER — Ambulatory Visit (INDEPENDENT_AMBULATORY_CARE_PROVIDER_SITE_OTHER): Payer: Managed Care, Other (non HMO) | Admitting: Surgery

## 2012-08-27 VITALS — BP 132/70 | HR 72 | Temp 98.2°F | Resp 16 | Ht 62.0 in | Wt 250.8 lb

## 2012-08-27 DIAGNOSIS — E041 Nontoxic single thyroid nodule: Secondary | ICD-10-CM

## 2012-08-27 NOTE — Progress Notes (Signed)
Patient ID: [name], female   DOB: 11-15-1975, 37 y.o.   MRN: [id number]  Chief Complaint  Patient presents with  . New Evaluation    eval thyroid nodule    HPI [name] is a 37 y.o. female.   HPI Patient sent at the request of Dr.Paz for right thyroid mass measuring 5 cm by ultrasound. This was picked up on recent physical exam. She does have some fullness in her neck and a dry cough. TSH was normal. No significant change in weight but there is a fluctuation upper. Past Medical History  Diagnosis Date  . Endometriosis   . Anxiety   . Dysmenorrhea     in the past  . Fibroid 2008    small  . Hormone disorder   . 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 Depression 08/10/2012    Past Surgical History  Procedure Laterality Date  . Ablation  2009  . Tubal ligation  2001  . Gynecologic cryosurgery  1997    Family History  Problem Relation Age of Onset  . Cervical cancer Mother   . Cancer Mother   . Cervical cancer Sister   . Cancer Sister   . Thyroid disease Maternal Aunt   . Lung cancer Maternal Grandfather   . Cancer Maternal Grandfather     Social History History  Substance Use Topics  . Smoking status: Never Smoker   . Smokeless tobacco: Not on file  . Alcohol Use: Yes     Comment: occasional    Allergies  Allergen Reactions  . Hydrocodone Nausea And Vomiting    Current Outpatient Prescriptions  Medication Sig Dispense Refill  . Multiple Vitamins-Minerals (MULTIVITAMIN PO) Take by mouth.      . venlafaxine XR (EFFEXOR XR) 37.5 MG 24 hr capsule 1 po every day, increase to 2 pills once a day after 2 weeks if needed  45 capsule  1   No current facility-administered medications for this visit.    Review of Systems Review of Systems  Constitutional: Negative for fever, chills and unexpected weight change.  HENT: Negative for hearing loss, congestion, sore throat, trouble swallowing and voice change.   Eyes: Negative for visual disturbance.  Respiratory: Negative  for cough and wheezing.   Cardiovascular: Negative for chest pain, palpitations and leg swelling.  Gastrointestinal: Negative for nausea, vomiting, abdominal pain, diarrhea, constipation, blood in stool, abdominal distention and anal bleeding.  Genitourinary: Negative for hematuria, vaginal bleeding and difficulty urinating.  Musculoskeletal: Negative for arthralgias.  Skin: Negative for rash and wound.  Neurological: Negative for seizures, syncope and headaches.  Hematological: Negative for adenopathy. Does not bruise/bleed easily.  Psychiatric/Behavioral: Negative for confusion.    Blood pressure 132/70, pulse 72, temperature 98.2 F (36.8 C), temperature source Temporal, resp. rate 16, height 5\' 2"  (1.575 m), weight 250 lb 12.8 oz (113.762 kg), last menstrual period 03/15/2007.  Physical Exam Physical Exam  Constitutional: She is oriented to person, place, and time. She appears well-developed and well-nourished.  HENT:  Head: Normocephalic and atraumatic.  Eyes: EOM are normal. Pupils are equal, round, and reactive to light.  Neck: Thyromegaly present.    Cardiovascular: Normal rate and regular rhythm.   Pulmonary/Chest: Effort normal and breath sounds normal.  Musculoskeletal: Normal range of motion.  Lymphadenopathy:    She has no cervical adenopathy.  Neurological: She is alert and oriented to person, place, and time.  Skin: Skin is warm and dry.  Psychiatric: She has a normal mood and affect. Her behavior is  normal. Judgment and thought content normal.    Data Reviewed Clinical Data: Enlarged thyroid on physical exam  THYROID ULTRASOUND  Technique: Ultrasound examination of the thyroid gland and adjacent  soft tissues was performed.  Comparison: None.  Findings:  Right thyroid lobe: 6.4 x 3.7 x 3.5 cm.  Left thyroid lobe: 4.8 x 1.6 x 1.8 cm.  Isthmus: 3 mm in thickness.  Focal nodules: The echogenicity of the thyroid gland is  inhomogeneous. Much of the right lobe  is occupied by a solid  dominant nodule of 4.9 x 3.0 x 3.5 cm. Findings meet consensus  criteria for biopsy. Ultrasound-guided fine needle aspiration  should be considered, as per the consensus statement: Management of  Thyroid Nodules Detected at [place]: Society of Radiologists in  Ultrasound Consensus Conference Statement. Radiology 2005; 237:794-  800.Smaller nodules are present of no more than 7 mm in diameter.  Lymphadenopathy: There are lymph nodes bilaterally none larger  than 5 mm in short axis diameter.  IMPRESSION:  Asymmetrically enlarged thyroid gland right greater than left with  the dominant solid nodule occupying much of the right lobe of  thyroid. Recommend biopsy of this dominant nodule.    Assessment    Right thyroid nodule 5 cm     Plan    Needs FNA of mass.  Further plans after this is done.        Stran Raper A. 08/27/2012, 3:49 PM

## 2012-08-27 NOTE — Patient Instructions (Signed)
Thyroid Diseases  Your thyroid is a butterfly-shaped gland in your neck. It is located just above your collarbone. It is one of your endocrine glands, which make hormones. The thyroid helps set your metabolism. Metabolism is how your body gets energy from the foods you eat.   Millions of people have thyroid diseases. Women experience thyroid problems more often than men. In fact, overactive thyroid problems (hyperthyroidism) occur in 1% of all women. If you have a thyroid disease, your body may use energy more slowly or quickly than it should.   Thyroid problems also include an immune disease where your body reacts against your thyroid gland (called thyroiditis). A different problem involves lumps and bumps (called nodules) that develop in the gland. The nodules are usually, but not always, noncancerous.  THE MOST COMMON THYROID PROBLEMS AND CAUSES ARE DISCUSSED BELOW  There are many causes for thyroid problems. Treatment depends upon the exact diagnosis and includes trying to reset your body's metabolism to a normal rate.  Hyperthyroidism  Too much thyroid hormone from an overactive thyroid gland is called hyperthyroidism. In hyperthyroidism, the body's metabolism speeds up. One of the most frequent forms of hyperthyroidism is known as Graves' disease. Graves' disease tends to run in families. Although Graves' is thought to be caused by a problem with the immune system, the exact nature of the genetic problem is unknown.  Hypothyroidism  Too little thyroid hormone from an underactive thyroid gland is called hypothyroidism. In hypothyroidism, the body's metabolism is slowed. Several things can cause this condition. Most causes affect the thyroid gland directly and hurt its ability to make enough hormone.   Rarely, there may be a pituitary gland tumor (located near the base of the brain). The tumor can block the pituitary from producing thyroid-stimulating hormone (TSH). Your body makes TSH to stimulate the thyroid  to work properly. If the pituitary does not make enough TSH, the thyroid fails to make enough hormones needed for good health.  Whether the problem is caused by thyroid conditions or by the pituitary gland, the result is that the thyroid is not making enough hormones. Hypothyroidism causes many physical and mental processes to become sluggish. The body consumes less oxygen and produces less body heat.  Thyroid Nodules  A thyroid nodule is a small swelling or lump in the thyroid gland. They are common. These nodules represent either a growth of thyroid tissue or a fluid-filled cyst. Both form a lump in the thyroid gland. Almost half of all people will have tiny thyroid nodules at some point in their lives. Typically, these are not noticeable until they become large and affect normal thyroid size. Larger nodules that are greater than a half inch across (about 1 centimeter) occur in about 5 percent of people.  Although most nodules are not cancerous, people who have them should seek medical care to rule out cancer. Also, some thyroid nodules may produce too much thyroid hormone or become too large. Large nodules or a large gland can interfere with breathing or swallowing or may cause neck discomfort.  Other problems  Other thyroid problems include cancer and thyroiditis. Thyroiditis is a malfunction of the body's immune system. Normally, the immune system works to defend the body against infection and other problems. When the immune system is not working properly, it may mistakenly attack normal cells, tissues, and organs. Examples of autoimmune diseases are Hashimoto's thyroiditis (which causes low thyroid function) and Graves' disease (which causes excess thyroid function).  SYMPTOMS   Symptoms   vary greatly depending upon the exact type of problem with the thyroid.  Hyperthyroidism-is when your thyroid is too active and makes more thyroid hormone than your body needs. The most common cause is Graves' Disease. Too  much thyroid hormone can cause some or all of the following symptoms:  · Anxiety.  · Irritability.  · Difficulty sleeping.  · Fatigue.  · A rapid or irregular heartbeat.  · A fine tremor of your hands or fingers.  · An increase in perspiration.  · Sensitivity to heat.  · Weight loss, despite normal food intake.  · Brittle hair.  · Enlargement of your thyroid gland (goiter).  · Light menstrual periods.  · Frequent bowel movements.  Graves' disease can specifically cause eye and skin problems. The skin problems involve reddening and swelling of the skin, often on your shins and on the top of your feet. Eye problems can include the following:  · Excess tearing and sensation of grit or sand in either or both eyes.  · Reddened or inflamed eyes.  · Widening of the space between your eyelids.  · Swelling of the lids and tissues around the eyes.  · Light sensitivity.  · Ulcers on the cornea.  · Double vision.  · Limited eye movements.  · Blurred or reduced vision.  Hypothyroidism- is when your thyroid gland is not active enough. This is more common than hyperthyroidism. Symptoms can vary a lot depending of the severity of the hormone deficiency. Symptoms may develop over a long period of time and can include several of the following:  · Fatigue.  · Sluggishness.  · Increased sensitivity to cold.  · Constipation.  · Pale, dry skin.  · A puffy face.  · Hoarse voice.  · High blood cholesterol level.  · Unexplained weight gain.  · Muscle aches, tenderness and stiffness.  · Pain, stiffness or swelling in your joints.  · Muscle weakness.  · Heavier than normal menstrual periods.  · Brittle fingernails and hair.  · Depression.  Thyroid Nodules - most do not cause signs or symptoms. Occasionally, some may become so large that you can feel or even see the swelling at the base of your neck. You may realize a lump or swelling is there when you are shaving or putting on makeup. Men might become aware of a nodule when shirt collars  suddenly feel too tight.  Some nodules produce too much thyroid hormone. This can produce the same symptoms as hyperthyroidism (see above).  Thyroid nodules are seldom cancerous. However, a nodule is more likely to be malignant (cancerous) if it:  · Grows quickly or feels hard.  · Causes you to become hoarse or to have trouble swallowing or breathing.  · Causes enlarged lymph nodes under your jaw or in your neck.  DIAGNOSIS   Because there are so many possible thyroid conditions, your caregiver may ask for a number of tests. They will do this in order to narrow down the exact diagnosis. These tests can include:  · Blood and antibody tests.  · Special thyroid scans using small, safe amounts of radioactive iodine.  · Ultrasound of the thyroid gland (particularly if there is a nodule or lump).  · Biopsy. This is usually done with a special needle. A needle biopsy is a procedure to obtain a sample of cells from the thyroid. The tissue will be tested in a lab and examined under a microscope.  TREATMENT   Treatment depends on the exact diagnosis.    Hyperthyroidism  · Beta-blockers help relieve many of the symptoms.  · Anti-thyroid medications prevent the thyroid from making excess hormones.  · Radioactive iodine treatment can destroy overactive thyroid cells. The iodine can permanently decrease the amount of hormone produced.  · Surgery to remove the thyroid gland.  · Treatments for eye problems that come from Graves' disease also include medications and special eye surgery, if felt to be appropriate.  Hypothyroidism  Thyroid replacement with levothyroxine is the mainstay of treatment. Treatment with thyroid replacement is usually lifelong and will require monitoring and adjustment from time to time.  Thyroid Nodules  · Watchful waiting. If a small nodule causes no symptoms or signs of cancer on biopsy, then no treatment may be chosen at first. Re-exam and re-checking blood tests would be the recommended  follow-up.  · Anti-thyroid medications or radioactive iodine treatment may be recommended if the nodules produce too much thyroid hormone (see Treatment for Hyperthyroidism above).  · Alcohol ablation. Injections of small amounts of ethyl alcohol (ethanol) can cause a non-cancerous nodule to shrink in size.  · Surgery (see Treatment for Hyperthyroidism above).  HOME CARE INSTRUCTIONS   · Take medications as instructed.  · Follow through on recommended testing.  SEEK MEDICAL CARE IF:   · You feel that you are developing symptoms of Hyperthyroidism or Hypothyroidism as described above.  · You develop a new lump/nodule in the neck/thyroid area that you had not noticed before.  · You feel that you are having side effects from medicines prescribed.  · You develop trouble breathing or swallowing.  SEEK IMMEDIATE MEDICAL CARE IF:   · You develop a fever of 102° F (38.9° C) or higher.  · You develop severe sweating.  · You develop palpitations and/or rapid heart beat.  · You develop shortness of breath.  · You develop nausea and vomiting.  · You develop extreme shakiness.  · You develop agitation.  · You develop lightheadedness or have a fainting episode.  Document Released: 12/26/2006 Document Revised: 05/23/2011 Document Reviewed: 12/26/2006  [employer]® Patient Information ©2014 [employer].

## 2012-08-27 NOTE — Telephone Encounter (Signed)
Per automated message to fax referral and [facility] will call patient with appt for thyroid biopsy. Order faxed to [phone].

## 2012-08-28 ENCOUNTER — Encounter: Payer: Self-pay | Admitting: Gynecology

## 2012-09-04 ENCOUNTER — Ambulatory Visit
Admission: RE | Admit: 2012-09-04 | Discharge: 2012-09-04 | Disposition: A | Discharge status: Home / Self Care | Payer: Managed Care, Other (non HMO) | Source: Ambulatory Visit | Attending: Surgery | Admitting: Surgery

## 2012-09-04 ENCOUNTER — Other Ambulatory Visit (HOSPITAL_COMMUNITY)
Admission: RE | Admit: 2012-09-04 | Discharge: 2012-09-04 | Disposition: A | Discharge status: Home / Self Care | Payer: Managed Care, Other (non HMO) | Source: Ambulatory Visit | Attending: Interventional Radiology | Admitting: Interventional Radiology

## 2012-09-04 DIAGNOSIS — E049 Nontoxic goiter, unspecified: Secondary | ICD-10-CM | POA: Insufficient documentation

## 2012-09-04 DIAGNOSIS — E041 Nontoxic single thyroid nodule: Secondary | ICD-10-CM

## 2012-09-06 ENCOUNTER — Other Ambulatory Visit: Payer: Managed Care, Other (non HMO)

## 2012-09-11 ENCOUNTER — Telehealth (INDEPENDENT_AMBULATORY_CARE_PROVIDER_SITE_OTHER): Payer: Self-pay

## 2012-09-11 NOTE — Telephone Encounter (Signed)
Called patient with path results and gave her follow up appointment.

## 2012-09-11 NOTE — Telephone Encounter (Signed)
Message copied by Brennan Bailey on Tue Sep 11, 2012  1:26 PM ------      Message from: Harriette Bouillon A      Created: Mon Sep 10, 2012 12:30 PM       Need follow up appt to discuss results.  No cancer but needs to be removed. ------

## 2012-10-08 ENCOUNTER — Ambulatory Visit (INDEPENDENT_AMBULATORY_CARE_PROVIDER_SITE_OTHER): Payer: Managed Care, Other (non HMO) | Admitting: Surgery

## 2012-10-08 ENCOUNTER — Encounter (INDEPENDENT_AMBULATORY_CARE_PROVIDER_SITE_OTHER): Payer: Self-pay | Admitting: Surgery

## 2012-10-08 VITALS — BP 118/82 | HR 72 | Resp 16 | Ht 62.0 in | Wt 257.8 lb

## 2012-10-08 DIAGNOSIS — E041 Nontoxic single thyroid nodule: Secondary | ICD-10-CM

## 2012-10-08 NOTE — Patient Instructions (Signed)
Recommend surgical removal but will watch it per your request.  Call if anything changes or use My Chart to communicate.  Return 6 months and we will check U/S.

## 2012-10-08 NOTE — Progress Notes (Signed)
Subjective:     Patient ID: [name], female   DOB: Jan 28, 1976, 37 y.o.   MRN: [id number]  HPI Patient returns in followup after I needle aspiration of thyroid nodule. There were suspicious cells but they could not exclude or include malignancy. She has no complaints.  Review of Systems  Constitutional: Negative.   HENT: Negative.        Objective:   Physical Exam  Constitutional: She appears well-developed and well-nourished.  HENT:  Head: Normocephalic and atraumatic.  Psychiatric: She has a normal mood and affect. Her behavior is normal. Judgment and thought content normal.       Assessment:     Thyroid nodule status post fine-needle aspiration with indeterminate results    Plan:     Discussed options of surgery versus observation. Pros and cons of each discussed. Recommended surgical thyroid lobectomy at a minimum but patient refuses. Explained risk of malignancy in this setting.  despite the above she would still like to follow. Recommend ultrasound in 6 months. Return to clinic in 6 months.

## 2013-01-17 ENCOUNTER — Other Ambulatory Visit: Payer: Self-pay

## 2013-02-26 ENCOUNTER — Encounter (INDEPENDENT_AMBULATORY_CARE_PROVIDER_SITE_OTHER): Payer: Self-pay | Admitting: Surgery

## 2013-08-14 ENCOUNTER — Emergency Department (HOSPITAL_COMMUNITY)
Admission: EM | Admit: 2013-08-14 | Discharge: 2013-08-14 | Disposition: A | Discharge status: Home / Self Care | Payer: Managed Care, Other (non HMO) | Source: Home / Self Care | Attending: Family Medicine | Admitting: Family Medicine

## 2013-08-14 ENCOUNTER — Encounter (HOSPITAL_COMMUNITY): Payer: Self-pay | Admitting: Emergency Medicine

## 2013-08-14 DIAGNOSIS — H6592 Unspecified nonsuppurative otitis media, left ear: Secondary | ICD-10-CM

## 2013-08-14 DIAGNOSIS — H6690 Otitis media, unspecified, unspecified ear: Secondary | ICD-10-CM

## 2013-08-14 DIAGNOSIS — H6691 Otitis media, unspecified, right ear: Secondary | ICD-10-CM

## 2013-08-14 DIAGNOSIS — H6590 Unspecified nonsuppurative otitis media, unspecified ear: Secondary | ICD-10-CM

## 2013-08-14 MED ORDER — AMOXICILLIN 500 MG PO CAPS: 500.0000 mg | ORAL_CAPSULE | Freq: Three times a day (TID) | ORAL | Status: DC

## 2013-08-14 MED ORDER — FLUTICASONE PROPIONATE 50 MCG/ACT NA SUSP: 2.0000 | Freq: Every day | NASAL | Status: DC

## 2013-08-14 NOTE — ED Notes (Signed)
States she has had an URI x couple of weeks, home treatment w OTC's, but concerned about decreased hearing both ears

## 2013-08-14 NOTE — Discharge Instructions (Signed)
Otitis Media, Adult Otitis media is redness, soreness, and swelling (inflammation) of the middle ear. Otitis media may be caused by allergies or, most commonly, by infection. Often it occurs as a complication of the common cold. SIGNS AND SYMPTOMS Symptoms of otitis media may include:  Earache.  Fever.  Ringing in your ear.  Headache.  Leakage of fluid from the ear. DIAGNOSIS To diagnose otitis media, your health care provider will examine your ear with an otoscope. This is an instrument that allows your health care provider to see into your ear in order to examine your eardrum. Your health care provider also will ask you questions about your symptoms. TREATMENT  Typically, otitis media resolves on its own within 3 5 days. Your health care provider may prescribe medicine to ease your symptoms of pain. If otitis media does not resolve within 5 days or is recurrent, your health care provider may prescribe antibiotic medicines if he or she suspects that a bacterial infection is the cause. HOME CARE INSTRUCTIONS   Take your medicine as directed until it is gone, even if you feel better after the first few days.  Only take over-the-counter or prescription medicines for pain, discomfort, or fever as directed by your health care provider.  Follow up with your health care provider as directed. SEEK MEDICAL CARE IF:  You have otitis media only in one ear or bleeding from your nose or both.  You notice a lump on your neck.  You are not getting better in 3 5 days.  You feel worse instead of better. SEEK IMMEDIATE MEDICAL CARE IF:   You have pain that is not controlled with medicine.  You have swelling, redness, or pain around your ear or stiffness in your neck.  You notice that part of your face is paralyzed.  You notice that the bone behind your ear (mastoid) is tender when you touch it. MAKE SURE YOU:   Understand these instructions.  Will watch your condition.  Will get help  right away if you are not doing well or get worse. Document Released: 12/04/2003 Document Revised: 12/19/2012 Document Reviewed: 09/25/2012 [facility] Patient Information 2014 [place], [place].  Serous Otitis Media  Serous otitis media is fluid in the middle ear space. This space contains the bones for hearing and air. Air in the middle ear space helps to transmit sound.  The air gets there through the eustachian tube. This tube goes from the back of the nose (nasopharynx) to the middle ear space. It keeps the pressure in the middle ear the same as the outside world. It also helps to drain fluid from the middle ear space. CAUSES  Serous otitis media occurs when the eustachian tube gets blocked. Blockage can come from:  Ear infections.  Colds and other upper respiratory infections.  Allergies.  Irritants such as cigarette smoke.  Sudden changes in air pressure (such as descending in an airplane).  Enlarged adenoids.  A mass in the nasopharynx. During colds and upper respiratory infections, the middle ear space can become temporarily filled with fluid. This can happen after an ear infection also. Once the infection clears, the fluid will generally drain out of the ear through the eustachian tube. If it does not, then serous otitis media occurs. SIGNS AND SYMPTOMS   Hearing loss.  A feeling of fullness in the ear, without pain.  Young children may not show any symptoms but may show slight behavioral changes, such as agitation, ear pulling, or crying. DIAGNOSIS  Serous otitis  media is diagnosed by an ear exam. Tests may be done to check on the movement of the eardrum. Hearing exams may also be done. TREATMENT  The fluid most often goes away without treatment. If allergy is the cause, allergy treatment may be helpful. Fluid that persists for several months may require minor surgery. A small tube is placed in the eardrum to:  Drain the fluid.  Restore the air in the middle ear space. In  certain situations, antibiotics are used to avoid surgery. Surgery may be done to remove enlarged adenoids (if this is the cause). HOME CARE INSTRUCTIONS   Keep children away from tobacco smoke.  Be sure to keep any follow-up appointments. SEEK MEDICAL CARE IF:   Your hearing is not better in 3 months.  Your hearing is worse.  You have ear pain.  You have drainage from the ear.  You have dizziness.  You have serous otitis media only in one ear or have any bleeding from your nose (epistaxis).  You notice a lump on your neck. MAKE SURE YOU:  Understand these instructions.   Will watch your condition.   Will get help right away if you are not doing well or get worse.  Document Released: 05/21/2003 Document Revised: 10/31/2012 Document Reviewed: 09/25/2012 [facility] Patient Information 2014 [place], [place].

## 2013-08-14 NOTE — ED Provider Notes (Signed)
CSN: [id number]     Arrival date & time 08/14/13  1645 History   First MD Initiated Contact with Patient 08/14/13 1748     Chief Complaint  Patient presents with  . Otalgia   (Consider location/radiation/quality/duration/timing/severity/associated sxs/prior Treatment) HPI Comments: Reports 2-3 weeks of sinus congestion with worsening bilateral ear pain and hearing loss. Denies fever, injury or drainage from ears. Little improvement with the use of OTC decongestants.   Patient is a 38 y.o. female presenting with ear pain. The history is provided by the patient.  Otalgia Affected ear: bilateral. Behind ear:  No abnormality Quality:  Aching and sharp Severity:  Moderate Onset quality:  Gradual Duration:  3 weeks Timing:  Constant Progression:  Worsening Chronicity:  New Associated symptoms: congestion and hearing loss   Associated symptoms: no fever, no neck pain, no rhinorrhea, no sore throat, no tinnitus and no vomiting     Past Medical History  Diagnosis Date  . Endometriosis   . Anxiety   . Dysmenorrhea     in the past  . Fibroid 2008    small  . Hormone disorder   . 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 Depression 08/10/2012   Past Surgical History  Procedure Laterality Date  . Ablation  2009  . Tubal ligation  2001  . Gynecologic cryosurgery  1997   Family History  Problem Relation Age of Onset  . Cervical cancer Mother   . Cancer Mother   . Cervical cancer Sister   . Cancer Sister   . Thyroid disease Maternal Aunt   . Lung cancer Maternal Grandfather   . Cancer Maternal Grandfather    History  Substance Use Topics  . Smoking status: Never Smoker   . Smokeless tobacco: Not on file  . Alcohol Use: Yes     Comment: occasional   OB History   Grav Para Term Preterm Abortions TAB SAB Ect Mult Living   3 2 2  1     2      Review of Systems  Constitutional: Negative for fever.  HENT: Positive for congestion, ear pain, hearing loss and sinus pressure. Negative for  postnasal drip, rhinorrhea, sore throat and tinnitus.   Eyes: Negative.   Respiratory: Negative.   Cardiovascular: Negative.   Gastrointestinal: Negative.  Negative for vomiting.  Musculoskeletal: Negative.  Negative for neck pain.  Neurological: Negative.     Allergies  Hydrocodone  Home Medications   Prior to Admission medications   Medication Sig Start Date End Date Taking? Authorizing Provider  amoxicillin (AMOXIL) 500 MG capsule Take 1 capsule (500 mg total) by mouth 3 (three) times daily. 08/14/13   Lahoma Rocker, PA  fluticasone (FLONASE) 50 MCG/ACT nasal spray Place 2 sprays into both nostrils daily. 08/14/13   Lahoma Rocker, PA  Multiple Vitamins-Minerals (MULTIVITAMIN PO) Take by mouth.    Historical Provider, MD   BP 129/81  Pulse 74  Temp(Src) 98.1 F (36.7 C) (Oral)  Resp 16  SpO2 97% Physical Exam  Nursing note and vitals reviewed. Constitutional: She is oriented to person, place, and time. She appears well-developed and well-nourished. No distress.  HENT:  Head: Normocephalic and atraumatic.  Right Ear: External ear and ear canal normal. No drainage, swelling or tenderness. No foreign bodies. No mastoid tenderness. Tympanic membrane is injected and erythematous. A middle ear effusion is present. No hemotympanum. Decreased hearing is noted.  Left Ear: External ear and ear canal normal. No drainage, swelling or tenderness. No foreign bodies. No  mastoid tenderness. Tympanic membrane is injected and erythematous. A middle ear effusion is present. No hemotympanum. Decreased hearing is noted.  Nose: Nose normal.  Mouth/Throat: Oropharynx is clear and moist.  R>L  Eyes: Conjunctivae are normal. No scleral icterus.  Neck: Normal range of motion. Neck supple.  Cardiovascular: Normal rate, regular rhythm and normal heart sounds.   Pulmonary/Chest: Effort normal and breath sounds normal.  Musculoskeletal: Normal range of motion.  Neurological: She is alert and  oriented to person, place, and time.  Skin: Skin is warm and dry. No rash noted. No erythema.  Psychiatric: She has a normal mood and affect. Her behavior is normal.    ED Course  Procedures (including critical care time) Labs Review Labs Reviewed - No data to display  Imaging Review No results found.   MDM   1. Left serous otitis media   2. Acute otitis media, right    Flonase as prescribed and Amoxicillin x 7 days as prescribed with PCP follow up if no improvement.    [place], [place] 08/14/13 [phone]

## 2013-08-15 NOTE — ED Provider Notes (Signed)
Medical screening examination/treatment/procedure(s) were performed by resident physician or non-physician practitioner and as supervising physician I was immediately available for consultation/collaboration.   Pauline Good MD.   Billy Fischer, MD 08/15/13 Vernelle Emerald

## 2013-08-23 ENCOUNTER — Telehealth: Payer: Self-pay | Admitting: Gynecology

## 2013-08-23 ENCOUNTER — Telehealth: Payer: Self-pay | Admitting: *Deleted

## 2013-08-23 ENCOUNTER — Ambulatory Visit: Payer: Managed Care, Other (non HMO) | Admitting: Gynecology

## 2013-08-23 NOTE — Telephone Encounter (Signed)
YES

## 2013-08-23 NOTE — Telephone Encounter (Signed)
Pt did receive the message about her appointment being dr/cx/rs and tried to call after 4:30pm yesterday to cancel and reschedule appointment. Pt wants to come in on 08-27-13 no availble appointments with Dr. Charlies Constable. Per Dr. Charlies Constable she does needs to come in asap. Please schedule

## 2013-08-23 NOTE — Telephone Encounter (Signed)
Routing to Dr.Lathrop for review.  Dr.Lathrop, patient scheduled for AEX on 6/16 at 2:30pm. Okay to close encounter?

## 2013-08-27 ENCOUNTER — Ambulatory Visit: Payer: Managed Care, Other (non HMO) | Admitting: Gynecology

## 2013-09-06 ENCOUNTER — Ambulatory Visit: Payer: Managed Care, Other (non HMO) | Admitting: Gynecology

## 2013-09-16 ENCOUNTER — Ambulatory Visit (INDEPENDENT_AMBULATORY_CARE_PROVIDER_SITE_OTHER): Payer: Managed Care, Other (non HMO) | Admitting: Gynecology

## 2013-09-16 ENCOUNTER — Encounter: Payer: Self-pay | Admitting: Gynecology

## 2013-09-16 VITALS — BP 112/64 | Resp 12 | Ht 62.0 in | Wt 263.0 lb

## 2013-09-16 DIAGNOSIS — Z0000 Encounter for general adult medical examination without abnormal findings: Secondary | ICD-10-CM

## 2013-09-16 DIAGNOSIS — E041 Nontoxic single thyroid nodule: Secondary | ICD-10-CM

## 2013-09-16 DIAGNOSIS — R232 Flushing: Secondary | ICD-10-CM

## 2013-09-16 DIAGNOSIS — Z01419 Encounter for gynecological examination (general) (routine) without abnormal findings: Secondary | ICD-10-CM

## 2013-09-16 DIAGNOSIS — R3915 Urgency of urination: Secondary | ICD-10-CM

## 2013-09-16 DIAGNOSIS — N951 Menopausal and female climacteric states: Secondary | ICD-10-CM

## 2013-09-16 LAB — POCT URINALYSIS DIPSTICK
Leukocytes, UA: NEGATIVE
Urobilinogen, UA: NEGATIVE

## 2013-09-16 LAB — HEMOGLOBIN, FINGERSTICK: HEMOGLOBIN, FINGERSTICK: 12.1 g/dL (ref 12.0–16.0)

## 2013-09-16 NOTE — Progress Notes (Signed)
38 y.o. Married [place] American female   [phone] here for annual exam. Pt is currently sexually active.  Pt reports hot flashes, night sweats, insomnia, dizziness and nausea, 28m getting worse.  Amenorrhea after ablation.  Pt reports weight gain- 19# since last annual.  Pt has a 5cm nodule on right lobe of thyroid but chose to not have it removed.  Pt did not have f/u u/s due 4/15.   No LMP recorded. Patient has had an ablation.          Sexually active: Yes.    The current method of family planning is tubal ligation and ablation.    Exercising: Yes.    cardio 1-2x/wk Last pap:  08/03/12 NEG HR HPV Alcohol: 1 or 2 drinks/ occasionally  Tobacco: no BSE: yes   Hgb: 12.1 ;  Urine: Negative   Health Maintenance  Topic Date Due  . Influenza Vaccine  10/12/2013  . Pap Smear  08/04/2015  . Tetanus/tdap  07/24/2016    Family History  Problem Relation Age of Onset  . Cervical cancer Mother   . Cancer Mother   . Cervical cancer Sister   . Cancer Sister   . Thyroid disease Maternal Aunt   . Lung cancer Maternal Grandfather   . Cancer Maternal Grandfather     Patient Active Problem List   Diagnosis Date Noted  . Depression 08/10/2012  . 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 HEADACHE 07/11/2007    Past Medical History  Diagnosis Date  . Endometriosis   . Anxiety   . Dysmenorrhea     in the past  . Fibroid 2008    small  . Hormone disorder   . 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 Depression 08/10/2012    Past Surgical History  Procedure Laterality Date  . Ablation  2009  . Tubal ligation  2001  . Gynecologic cryosurgery  1997    Allergies: Hydrocodone  Current Outpatient Prescriptions  Medication Sig Dispense Refill  . amoxicillin (AMOXIL) 500 MG capsule Take 1 capsule (500 mg total) by mouth 3 (three) times daily.  21 capsule  0  . fluticasone (FLONASE) 50 MCG/ACT nasal spray Place 2 sprays into both nostrils daily.  16 g  1  . Multiple Vitamins-Minerals (MULTIVITAMIN PO) Take by  mouth.       No current facility-administered medications for this visit.    ROS: Pertinent items are noted in HPI.  Exam:    BP 112/64  Resp 12  Ht 5\' 2"  (1.575 m)  Wt 263 lb (119.296 kg)  BMI 48.09 kg/m2 Weight change: @WEIGHTCHANGE @ Last 3 height recordings:  Ht Readings from Last 3 Encounters:  09/16/13 5\' 2"  (1.575 m)  10/08/12 5\' 2"  (1.575 m)  08/27/12 5\' 2"  (1.575 m)   General appearance: alert, cooperative and appears stated age Head: Normocephalic, without obvious abnormality, atraumatic Neck: no adenopathy, no carotid bruit, no JVD, supple, nodule on right, nontender Lungs: clear to auscultation bilaterally Breasts: normal appearance, no masses or tenderness Heart: regular rate and rhythm, S1, S2 normal, no murmur, click, rub or gallop Abdomen: soft, non-tender; bowel sounds normal; no masses,  no organomegaly Extremities: extremities normal, atraumatic, no cyanosis or edema Skin: Skin color, texture, turgor normal. No rashes or lesions Lymph nodes: Cervical, supraclavicular, and axillary nodes normal. no inguinal nodes palpated Neurologic: Grossly normal   Pelvic: External genitalia:  no lesions              Urethra: normal appearing urethra with no masses, tenderness  or lesions              Bartholins and Skenes: Bartholin's, Urethra, Skene's normal                 Vagina: normal appearing vagina with normal color and discharge, no lesions              Cervix: normal appearance              Pap taken: No.        Bimanual Exam:  Uterus:  uterus is normal size, shape, consistency and nontender                                      Adnexa:    no masses                                      Rectovaginal: Confirms                                      Anus:  normal sphincter tone, no lesions     1. Routine gynecological examination  counseled on breast self exam, adequate intake of calcium and vitamin D, diet and exercise return annually or prn   2. Laboratory  examination ordered as part of a routine general medical examination  - Hemoglobin, fingerstick - POCT Urinalysis Dipstick  3. Hot flashes May be related to nodule, will check hormones - Follicle stimulating hormone - TSH  4. Thyroid nodule Long discussion regarding her thyroid nodule-5cm.  reviewed the recommendations to remove.  Discussed the ddx and information re nodules from UpTo Date given to patient, recommend she follow up with general surgery as well. Pt is agreeable, questions addressed - TSH - US Soft Tissue Head/Neck; Future    An After Visit Summary was printed and given to the patient.

## 2013-09-17 ENCOUNTER — Telehealth: Payer: Self-pay | Admitting: *Deleted

## 2013-09-17 LAB — TSH: TSH: 3.027 u[IU]/mL (ref 0.350–4.500)

## 2013-09-17 LAB — FOLLICLE STIMULATING HORMONE: FSH: 3.3 m[IU]/mL

## 2013-09-17 NOTE — Telephone Encounter (Signed)
Calling patient in regards to coming in and leaving urine specimen for urine culture.   Left Message To Call Back

## 2013-09-17 NOTE — Telephone Encounter (Signed)
S/w patient she is coming around 4:30 today

## 2013-09-17 NOTE — Addendum Note (Signed)
Addended by: Alfonzo Feller on: 09/17/2013 09:06 AM   Modules accepted: Orders

## 2013-09-18 ENCOUNTER — Telehealth: Payer: Self-pay

## 2013-09-18 NOTE — Telephone Encounter (Signed)
Spoke with patient. Advised spoke with [facility] and appointment for thyroid ultrasound is scheduled for July 10th at 1:15pm. Patient agreeable to date and time.  Routing to provider for final review. Patient agreeable to disposition. Will close encounter

## 2013-09-19 LAB — URINE CULTURE

## 2013-09-20 ENCOUNTER — Telehealth: Payer: Self-pay

## 2013-09-20 ENCOUNTER — Ambulatory Visit
Admission: RE | Admit: 2013-09-20 | Discharge: 2013-09-20 | Disposition: A | Discharge status: Home / Self Care | Payer: Managed Care, Other (non HMO) | Source: Ambulatory Visit | Attending: Gynecology | Admitting: Gynecology

## 2013-09-20 DIAGNOSIS — E041 Nontoxic single thyroid nodule: Secondary | ICD-10-CM

## 2013-09-20 NOTE — Telephone Encounter (Signed)
Spoke with patient-states her appointment is today 09/20/13 at 1:00.//kn

## 2013-09-20 NOTE — Telephone Encounter (Signed)
lmtcb-? If she has had thyroid US or rescheduled?

## 2013-09-27 NOTE — Telephone Encounter (Signed)
See second encounter from 08-23-13.  Routing to provider for final review. Patient agreeable to disposition. Will close encounter

## 2013-10-01 ENCOUNTER — Telehealth: Payer: Self-pay | Admitting: Emergency Medicine

## 2013-10-01 NOTE — Telephone Encounter (Signed)
Spoke with patient and message from Dr. Charlies Constable given.  Patient agreeable to plan of care.  She will call Dr. Brantley Stage at [facility]. Declines our office to set up appointment, states she will call.

## 2013-10-01 NOTE — Telephone Encounter (Signed)
Message copied by Michele Mcalpine on Tue Oct 01, 2013 10:13 AM ------      Message from: Elveria Rising      Created: Mon Sep 23, 2013  8:13 AM       Nodule is slightly bigger than last year, recommend she goes back to general surgeon to evaluate and treat, I will forward the u/s to surgeon, I had forwarded my note previously ------

## 2013-11-05 ENCOUNTER — Encounter: Payer: Self-pay | Admitting: Gynecology

## 2013-12-27 ENCOUNTER — Other Ambulatory Visit: Payer: Self-pay

## 2014-01-13 ENCOUNTER — Encounter: Payer: Self-pay | Admitting: Gynecology

## 2014-02-11 ENCOUNTER — Telehealth: Payer: Self-pay | Admitting: Gynecology

## 2014-02-11 NOTE — Telephone Encounter (Signed)
Left message to call regarding cancelled appt.

## 2014-04-23 ENCOUNTER — Encounter: Payer: Self-pay | Admitting: Obstetrics & Gynecology

## 2014-07-11 ENCOUNTER — Emergency Department (HOSPITAL_COMMUNITY)
Admission: EM | Admit: 2014-07-11 | Discharge: 2014-07-11 | Disposition: A | Discharge status: Home / Self Care | Payer: Managed Care, Other (non HMO) | Source: Home / Self Care | Attending: Family Medicine | Admitting: Family Medicine

## 2014-07-11 ENCOUNTER — Encounter (HOSPITAL_COMMUNITY): Payer: Self-pay | Admitting: Emergency Medicine

## 2014-07-11 DIAGNOSIS — G44201 Tension-type headache, unspecified, intractable: Secondary | ICD-10-CM

## 2014-07-11 MED ORDER — METOCLOPRAMIDE HCL 5 MG/ML IJ SOLN
INTRAMUSCULAR | Status: AC
  Filled 2014-07-11: qty 2

## 2014-07-11 MED ORDER — DEXAMETHASONE SODIUM PHOSPHATE 10 MG/ML IJ SOLN
10.0000 mg | Freq: Once | INTRAMUSCULAR | Status: AC
  Administered 2014-07-11: 10 mg via INTRAMUSCULAR

## 2014-07-11 MED ORDER — METOCLOPRAMIDE HCL 5 MG/ML IJ SOLN
5.0000 mg | Freq: Once | INTRAMUSCULAR | Status: AC
  Administered 2014-07-11: 5 mg via INTRAMUSCULAR

## 2014-07-11 MED ORDER — KETOROLAC TROMETHAMINE 60 MG/2ML IM SOLN
INTRAMUSCULAR | Status: AC
  Filled 2014-07-11: qty 2

## 2014-07-11 MED ORDER — KETOROLAC TROMETHAMINE 60 MG/2ML IM SOLN
60.0000 mg | Freq: Once | INTRAMUSCULAR | Status: AC
  Administered 2014-07-11: 60 mg via INTRAMUSCULAR

## 2014-07-11 MED ORDER — DEXAMETHASONE SODIUM PHOSPHATE 10 MG/ML IJ SOLN
INTRAMUSCULAR | Status: AC
  Filled 2014-07-11: qty 1

## 2014-07-11 MED ORDER — DICLOFENAC SODIUM 75 MG PO TBEC: 75.0000 mg | DELAYED_RELEASE_TABLET | Freq: Two times a day (BID) | ORAL | Status: DC | PRN

## 2014-07-11 NOTE — Discharge Instructions (Signed)
Thank you for coming in today. °Go to the emergency room if your headache becomes excruciating or you have weakness or numbness or uncontrolled vomiting.  ° ° °Migraine Headache °A migraine headache is an intense, throbbing pain on one or both sides of your head. A migraine can last for 30 minutes to several hours. °CAUSES  °The exact cause of a migraine headache is not always known. However, a migraine may be caused when nerves in the brain become irritated and release chemicals that cause inflammation. This causes pain. °Certain things may also trigger migraines, such as: °· Alcohol. °· Smoking. °· Stress. °· Menstruation. °· Aged cheeses. °· Foods or drinks that contain nitrates, glutamate, aspartame, or tyramine. °· Lack of sleep. °· Chocolate. °· Caffeine. °· Hunger. °· Physical exertion. °· Fatigue. °· Medicines used to treat chest pain (nitroglycerine), birth control pills, estrogen, and some blood pressure medicines. °SIGNS AND SYMPTOMS °· Pain on one or both sides of your head. °· Pulsating or throbbing pain. °· Severe pain that prevents daily activities. °· Pain that is aggravated by any physical activity. °· Nausea, vomiting, or both. °· Dizziness. °· Pain with exposure to bright lights, loud noises, or activity. °· General sensitivity to bright lights, loud noises, or smells. °Before you get a migraine, you may get warning signs that a migraine is coming (aura). An aura may include: °· Seeing flashing lights. °· Seeing bright spots, halos, or zigzag lines. °· Having tunnel vision or blurred vision. °· Having feelings of numbness or tingling. °· Having trouble talking. °· Having muscle weakness. °DIAGNOSIS  °A migraine headache is often diagnosed based on: °· Symptoms. °· Physical exam. °· A CT scan or MRI of your head. These imaging tests cannot diagnose migraines, but they can help rule out other causes of headaches. °TREATMENT °Medicines may be given for pain and nausea. Medicines can also be given to  help prevent recurrent migraines.  °HOME CARE INSTRUCTIONS °· Only take over-the-counter or prescription medicines for pain or discomfort as directed by your health care provider. The use of long-term narcotics is not recommended. °· Lie down in a dark, quiet room when you have a migraine. °· Keep a journal to find out what may trigger your migraine headaches. For example, write down: °¨ What you eat and drink. °¨ How much sleep you get. °¨ Any change to your diet or medicines. °· Limit alcohol consumption. °· Quit smoking if you smoke. °· Get 7-9 hours of sleep, or as recommended by your health care provider. °· Limit stress. °· Keep lights dim if bright lights bother you and make your migraines worse. °SEEK IMMEDIATE MEDICAL CARE IF:  °· Your migraine becomes severe. °· You have a fever. °· You have a stiff neck. °· You have vision loss. °· You have muscular weakness or loss of muscle control. °· You start losing your balance or have trouble walking. °· You feel faint or pass out. °· You have severe symptoms that are different from your first symptoms. °MAKE SURE YOU:  °· Understand these instructions. °· Will watch your condition. °· Will get help right away if you are not doing well or get worse. °Document Released: 02/28/2005 Document Revised: 07/15/2013 Document Reviewed: 11/05/2012 °ExitCare® Patient Information ©2015 [employer]. This information is not intended to replace advice given to you by your health care provider. Make sure you discuss any questions you have with your health care provider. ° °

## 2014-07-11 NOTE — ED Provider Notes (Signed)
[name] is a 39 y.o. female who presents to Urgent Care today for headache. Patient has had a headache off and on for the last 5 days becoming intractable yesterday. She notes bitemporal pain as well as facial pain. She notes photophobia. She notes nausea as well. She notes some dizziness described as vague and stability. No weakness or numbness loss of function. Last week she had fever as well which has since resolved. No vomiting or diarrhea.   Past Medical History  Diagnosis Date  . Endometriosis   . Anxiety   . Dysmenorrhea     in the past  . Fibroid 2008    small  . Hormone disorder   . 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 Depression 08/10/2012   Past Surgical History  Procedure Laterality Date  . Ablation  2009  . Tubal ligation  2001  . Gynecologic cryosurgery  1997   History  Substance Use Topics  . Smoking status: Never Smoker   . Smokeless tobacco: Not on file  . Alcohol Use: Yes     Comment: occasional   ROS as above Medications: No current facility-administered medications for this encounter.   Current Outpatient Prescriptions  Medication Sig Dispense Refill  . diclofenac (VOLTAREN) 75 MG EC tablet Take 1 tablet (75 mg total) by mouth 2 (two) times daily as needed. 30 tablet 0  . fluticasone (FLONASE) 50 MCG/ACT nasal spray Place 2 sprays into both nostrils daily. 16 g 1  . Multiple Vitamins-Minerals (MULTIVITAMIN PO) Take by mouth.     Allergies  Allergen Reactions  . Hydrocodone Nausea And Vomiting     Exam:  BP 107/69 mmHg  Pulse 70  Temp(Src) 98.1 F (36.7 C) (Oral)  Resp 18  SpO2 100% Gen: Well NAD HEENT: EOMI,  MMM PERRLA normal-appearing posterior pharynx. Sinuses are nontender. Lungs: Normal work of breathing. CTABL Heart: RRR no MRG Abd: NABS, Soft. Nondistended, Nontender Exts: Brisk capillary refill, warm and well perfused.  Neuro: Alert and oriented cranial nerves II through XII are intact normal coordination balance sensation strength and  reflexes.  Patient was given 60 mg IM Toradol, 10 mg IM dexamethasone, 5 mg IM Reglan.  No results found for this or any previous visit (from the past 24 hour(s)). No results found.  Assessment and Plan: 39 y.o. female with headache. Patient improved with headache cocktail as above. Treat with diclofenac and follow-up with PCP.  Discussed warning signs or symptoms. Please see discharge instructions. Patient expresses understanding.     Gregor Hams, MD 07/11/14 1325

## 2014-07-11 NOTE — ED Notes (Signed)
C/o cold sx onset Sunday Sx include: dizziness, HA, nauseas, fevers, chills, skin is sensitive to the touch  Taking advil w/no relief Alert, no signs of acute distress.

## 2014-09-19 ENCOUNTER — Ambulatory Visit: Payer: Managed Care, Other (non HMO) | Admitting: Gynecology

## 2016-01-13 ENCOUNTER — Ambulatory Visit: Payer: Self-pay | Admitting: Surgery

## 2017-04-13 ENCOUNTER — Other Ambulatory Visit: Payer: Self-pay | Admitting: Nurse Practitioner

## 2017-04-17 ENCOUNTER — Other Ambulatory Visit (HOSPITAL_COMMUNITY): Payer: Self-pay | Admitting: Nurse Practitioner

## 2017-04-17 DIAGNOSIS — C73 Malignant neoplasm of thyroid gland: Secondary | ICD-10-CM

## 2017-05-01 ENCOUNTER — Other Ambulatory Visit (HOSPITAL_COMMUNITY): Payer: Managed Care, Other (non HMO)

## 2017-05-02 ENCOUNTER — Other Ambulatory Visit (HOSPITAL_COMMUNITY): Payer: Managed Care, Other (non HMO)

## 2017-05-03 ENCOUNTER — Other Ambulatory Visit (HOSPITAL_COMMUNITY): Payer: Managed Care, Other (non HMO)

## 2017-05-05 ENCOUNTER — Other Ambulatory Visit (HOSPITAL_COMMUNITY): Payer: Managed Care, Other (non HMO)

## 2017-05-15 ENCOUNTER — Encounter (HOSPITAL_COMMUNITY)
Admission: RE | Admit: 2017-05-15 | Discharge: 2017-05-15 | Disposition: A | Discharge status: Home / Self Care | Payer: Managed Care, Other (non HMO) | Source: Ambulatory Visit | Attending: Nurse Practitioner | Admitting: Nurse Practitioner

## 2017-05-15 DIAGNOSIS — C73 Malignant neoplasm of thyroid gland: Secondary | ICD-10-CM | POA: Insufficient documentation

## 2017-05-15 MED ORDER — THYROTROPIN ALFA 1.1 MG IM SOLR
0.9000 mg | INTRAMUSCULAR | Status: AC
  Administered 2017-05-15: 0.9 mg via INTRAMUSCULAR

## 2017-05-15 MED ORDER — THYROTROPIN ALFA 1.1 MG IM SOLR
INTRAMUSCULAR | Status: AC
  Filled 2017-05-15: qty 0.9

## 2017-05-16 ENCOUNTER — Encounter (HOSPITAL_COMMUNITY)
Admission: RE | Admit: 2017-05-16 | Discharge: 2017-05-16 | Disposition: A | Discharge status: Home / Self Care | Payer: Managed Care, Other (non HMO) | Source: Ambulatory Visit | Attending: Nurse Practitioner | Admitting: Nurse Practitioner

## 2017-05-16 DIAGNOSIS — C73 Malignant neoplasm of thyroid gland: Secondary | ICD-10-CM | POA: Diagnosis not present

## 2017-05-16 MED ORDER — THYROTROPIN ALFA 1.1 MG IM SOLR
0.9000 mg | INTRAMUSCULAR | Status: AC
  Administered 2017-05-16: 0.9 mg via INTRAMUSCULAR

## 2017-05-16 MED ORDER — THYROTROPIN ALFA 1.1 MG IM SOLR
INTRAMUSCULAR | Status: AC
Start: 2017-05-16 — End: 2017-05-16
  Filled 2017-05-16: qty 0.9

## 2017-05-17 ENCOUNTER — Encounter (HOSPITAL_COMMUNITY)
Admission: RE | Admit: 2017-05-17 | Discharge: 2017-05-17 | Disposition: A | Discharge status: Home / Self Care | Payer: Managed Care, Other (non HMO) | Source: Ambulatory Visit | Attending: Nurse Practitioner | Admitting: Nurse Practitioner

## 2017-05-17 DIAGNOSIS — C73 Malignant neoplasm of thyroid gland: Secondary | ICD-10-CM | POA: Diagnosis not present

## 2017-05-17 LAB — HCG, SERUM, QUALITATIVE: PREG SERUM: NEGATIVE

## 2017-05-19 ENCOUNTER — Encounter (HOSPITAL_COMMUNITY)
Admission: RE | Admit: 2017-05-19 | Discharge: 2017-05-19 | Disposition: A | Discharge status: Home / Self Care | Payer: Managed Care, Other (non HMO) | Source: Ambulatory Visit | Attending: Nurse Practitioner | Admitting: Nurse Practitioner

## 2017-10-16 ENCOUNTER — Ambulatory Visit: Payer: Managed Care, Other (non HMO) | Admitting: Family Medicine

## 2017-10-16 ENCOUNTER — Encounter: Payer: Self-pay | Admitting: Family Medicine

## 2017-10-16 VITALS — BP 112/59 | HR 72 | Ht 62.0 in | Wt 266.0 lb

## 2017-10-16 DIAGNOSIS — Z1231 Encounter for screening mammogram for malignant neoplasm of breast: Secondary | ICD-10-CM

## 2017-10-16 DIAGNOSIS — Z01419 Encounter for gynecological examination (general) (routine) without abnormal findings: Secondary | ICD-10-CM

## 2017-10-16 DIAGNOSIS — Z124 Encounter for screening for malignant neoplasm of cervix: Secondary | ICD-10-CM

## 2017-10-16 NOTE — Patient Instructions (Signed)
Preventive Care 41-39 Years, Female Preventive care refers to lifestyle choices and visits with your health care provider that can promote health and wellness. What does preventive care include?  A yearly physical exam. This is also called an annual well check.  Dental exams once or twice a year.  Routine eye exams. Ask your health care provider how often you should have your eyes checked.  Personal lifestyle choices, including: ? Daily care of your teeth and gums. ? Regular physical activity. ? Eating a healthy diet. ? Avoiding tobacco and drug use. ? Limiting alcohol use. ? Practicing safe sex. ? Taking vitamin and mineral supplements as recommended by your health care provider. What happens during an annual well check? The services and screenings done by your health care provider during your annual well check will depend on your age, overall health, lifestyle risk factors, and family history of disease. Counseling Your health care provider may ask you questions about your:  Alcohol use.  Tobacco use.  Drug use.  Emotional well-being.  Home and relationship well-being.  Sexual activity.  Eating habits.  Work and work Statistician.  Method of birth control.  Menstrual cycle.  Pregnancy history.  Screening You may have the following tests or measurements:  Height, weight, and BMI.  Diabetes screening. This is done by checking your blood sugar (glucose) after you have not eaten for a while (fasting).  Blood pressure.  Lipid and cholesterol levels. These may be checked every 5 years starting at age 38.  Skin check.  Hepatitis C blood test.  Hepatitis B blood test.  Sexually transmitted disease (STD) testing.  BRCA-related cancer screening. This may be done if you have a family history of breast, ovarian, tubal, or peritoneal cancers.  Pelvic exam and Pap test. This may be done every 3 years starting at age 38. Starting at age 30, this may be done  every 5 years if you have a Pap test in combination with an HPV test.  Discuss your test results, treatment options, and if necessary, the need for more tests with your health care provider. Vaccines Your health care provider may recommend certain vaccines, such as:  Influenza vaccine. This is recommended every year.  Tetanus, diphtheria, and acellular pertussis (Tdap, Td) vaccine. You may need a Td booster every 10 years.  Varicella vaccine. You may need this if you have not been vaccinated.  HPV vaccine. If you are 41 or younger, you may need three doses over 6 months.  Measles, mumps, and rubella (MMR) vaccine. You may need at least one dose of MMR. You may also need a second dose.  Pneumococcal 13-valent conjugate (PCV13) vaccine. You may need this if you have certain conditions and were not previously vaccinated.  Pneumococcal polysaccharide (PPSV23) vaccine. You may need one or two doses if you smoke cigarettes or if you have certain conditions.  Meningococcal vaccine. One dose is recommended if you are age 68-21 years and a first-year college student living in a residence hall, or if you have one of several medical conditions. You may also need additional booster doses.  Hepatitis A vaccine. You may need this if you have certain conditions or if you travel or work in places where you may be exposed to hepatitis A.  Hepatitis B vaccine. You may need this if you have certain conditions or if you travel or work in places where you may be exposed to hepatitis B.  Haemophilus influenzae type b (Hib) vaccine. You may need this  if you have certain risk factors.  Talk to your health care provider about which screenings and vaccines you need and how often you need them. This information is not intended to replace advice given to you by your health care provider. Make sure you discuss any questions you have with your health care provider. Document Released: 04/26/2001 Document Revised:  11/18/2015 Document Reviewed: 12/30/2014 Elsevier Interactive Patient Education  2018 [employer]  

## 2017-10-16 NOTE — Progress Notes (Signed)
  Subjective:     [name] is a 42 y.o. female and is here for a comprehensive physical exam. The patient reports problems - occasional vaginal discharge.  S/p BTL and endometrial ablation. Has occasional cycles which last 1 day. Works for [employer]. Was pre-diabetes, working on limiting carbs, sweets.  Last pap in 2018 WNL with neg HRHPV. The following portions of the patient's history were reviewed and updated as appropriate: allergies, current medications, past family history, past medical history, past social history, past surgical history and problem list.  Review of Systems Pertinent items noted in HPI and remainder of comprehensive ROS otherwise negative.   Objective:    BP (!) 112/59   Pulse 72   Ht 5\' 2"  (1.575 m)   Wt 266 lb (120.7 kg)   LMP 08/26/2017 (Within Weeks)   BMI 48.65 kg/m  General appearance: alert, cooperative and appears stated age Head: Normocephalic, without obvious abnormality, atraumatic Neck: no adenopathy, supple, symmetrical, trachea midline and thyroid not enlarged, symmetric, no tenderness/mass/nodules Lungs: clear to auscultation bilaterally Breasts: normal appearance, no masses or tenderness Heart: regular rate and rhythm, S1, S2 normal, no murmur, click, rub or gallop Abdomen: soft, non-tender; bowel sounds normal; no masses,  no organomegaly Pelvic: cervix normal in appearance, external genitalia normal, no adnexal masses or tenderness, no cervical motion tenderness, uterus normal size, shape, and consistency and vagina normal without discharge Extremities: extremities normal, atraumatic, no cyanosis or edema Pulses: 2+ and symmetric Skin: Skin color, texture, turgor normal. No rashes or lesions Lymph nodes: Cervical, supraclavicular, and axillary nodes normal. Neurologic: Grossly normal    Assessment:    Healthy female exam.      Plan:    Mammogram ordered Does not need pap today (2018) Has PCP for labs. Problem List Items Addressed  This Visit    None    Visit Diagnoses    Visit for screening mammogram    -  Primary   Relevant Orders   MM Digital Screening   Screening for malignant neoplasm of cervix       Encounter for gynecological examination without abnormal finding          See After Visit Summary for Counseling Recommendations

## 2017-11-08 ENCOUNTER — Ambulatory Visit
Admission: RE | Admit: 2017-11-08 | Discharge: 2017-11-08 | Disposition: A | Discharge status: Home / Self Care | Payer: Managed Care, Other (non HMO) | Source: Ambulatory Visit | Attending: Family Medicine | Admitting: Family Medicine

## 2017-11-08 DIAGNOSIS — Z1231 Encounter for screening mammogram for malignant neoplasm of breast: Secondary | ICD-10-CM

## 2017-11-09 ENCOUNTER — Other Ambulatory Visit: Payer: Self-pay | Admitting: Family Medicine

## 2017-11-09 DIAGNOSIS — R928 Other abnormal and inconclusive findings on diagnostic imaging of breast: Secondary | ICD-10-CM

## 2017-11-14 ENCOUNTER — Inpatient Hospital Stay
Admission: RE | Admit: 2017-11-14 | Discharge: 2017-11-14 | Disposition: A | Discharge status: Home / Self Care | Payer: Managed Care, Other (non HMO) | Source: Ambulatory Visit | Attending: Family Medicine | Admitting: Family Medicine

## 2017-11-14 ENCOUNTER — Other Ambulatory Visit: Payer: Managed Care, Other (non HMO)

## 2017-11-21 ENCOUNTER — Ambulatory Visit
Admission: RE | Admit: 2017-11-21 | Discharge: 2017-11-21 | Disposition: A | Discharge status: Home / Self Care | Payer: Managed Care, Other (non HMO) | Source: Ambulatory Visit | Attending: Family Medicine | Admitting: Family Medicine

## 2017-11-21 DIAGNOSIS — R928 Other abnormal and inconclusive findings on diagnostic imaging of breast: Secondary | ICD-10-CM

## 2019-01-04 ENCOUNTER — Other Ambulatory Visit: Payer: Self-pay | Admitting: Family Medicine

## 2019-01-04 DIAGNOSIS — Z1231 Encounter for screening mammogram for malignant neoplasm of breast: Secondary | ICD-10-CM

## 2019-02-25 ENCOUNTER — Ambulatory Visit
Admission: RE | Admit: 2019-02-25 | Discharge: 2019-02-25 | Disposition: A | Discharge status: Home / Self Care | Payer: Managed Care, Other (non HMO) | Source: Ambulatory Visit | Attending: Family Medicine | Admitting: Family Medicine

## 2019-02-25 ENCOUNTER — Other Ambulatory Visit: Payer: Self-pay

## 2019-02-25 DIAGNOSIS — Z1231 Encounter for screening mammogram for malignant neoplasm of breast: Secondary | ICD-10-CM

## 2020-01-28 ENCOUNTER — Other Ambulatory Visit: Payer: Self-pay | Admitting: Family Medicine

## 2020-01-28 DIAGNOSIS — Z1231 Encounter for screening mammogram for malignant neoplasm of breast: Secondary | ICD-10-CM

## 2020-03-02 ENCOUNTER — Ambulatory Visit: Payer: Managed Care, Other (non HMO)

## 2020-04-09 ENCOUNTER — Ambulatory Visit
Admission: RE | Admit: 2020-04-09 | Discharge: 2020-04-09 | Disposition: A | Discharge status: Home / Self Care | Payer: Managed Care, Other (non HMO) | Source: Ambulatory Visit | Attending: Family Medicine | Admitting: Family Medicine

## 2020-04-09 ENCOUNTER — Other Ambulatory Visit: Payer: Self-pay

## 2020-04-09 DIAGNOSIS — Z1231 Encounter for screening mammogram for malignant neoplasm of breast: Secondary | ICD-10-CM

## 2021-03-17 ENCOUNTER — Other Ambulatory Visit: Payer: Self-pay | Admitting: Family Medicine

## 2021-03-17 DIAGNOSIS — Z1231 Encounter for screening mammogram for malignant neoplasm of breast: Secondary | ICD-10-CM

## 2021-04-12 ENCOUNTER — Ambulatory Visit
Admission: RE | Admit: 2021-04-12 | Discharge: 2021-04-12 | Disposition: A | Discharge status: Home / Self Care | Payer: Managed Care, Other (non HMO) | Source: Ambulatory Visit | Attending: Family Medicine | Admitting: Family Medicine

## 2021-04-12 DIAGNOSIS — Z1231 Encounter for screening mammogram for malignant neoplasm of breast: Secondary | ICD-10-CM

## 2021-04-13 ENCOUNTER — Other Ambulatory Visit: Payer: Self-pay | Admitting: Family Medicine

## 2021-04-13 DIAGNOSIS — R928 Other abnormal and inconclusive findings on diagnostic imaging of breast: Secondary | ICD-10-CM

## 2021-05-11 ENCOUNTER — Ambulatory Visit
Admission: RE | Admit: 2021-05-11 | Discharge: 2021-05-11 | Disposition: A | Discharge status: Home / Self Care | Payer: Managed Care, Other (non HMO) | Source: Ambulatory Visit | Attending: Family Medicine | Admitting: Family Medicine

## 2021-05-11 ENCOUNTER — Ambulatory Visit: Admission: RE | Admit: 2021-05-11 | Payer: Managed Care, Other (non HMO) | Source: Ambulatory Visit

## 2021-05-11 DIAGNOSIS — R928 Other abnormal and inconclusive findings on diagnostic imaging of breast: Secondary | ICD-10-CM

## 2022-12-04 IMAGING — MG DIGITAL SCREENING BILAT W/ CAD
4 series · 4 of 4 positions shown · non-contrast
Comparison: Previous exam(s).

CLINICAL DATA: Screening.

EXAM:
DIGITAL SCREENING BILATERAL MAMMOGRAM WITH CAD

[R MLO]
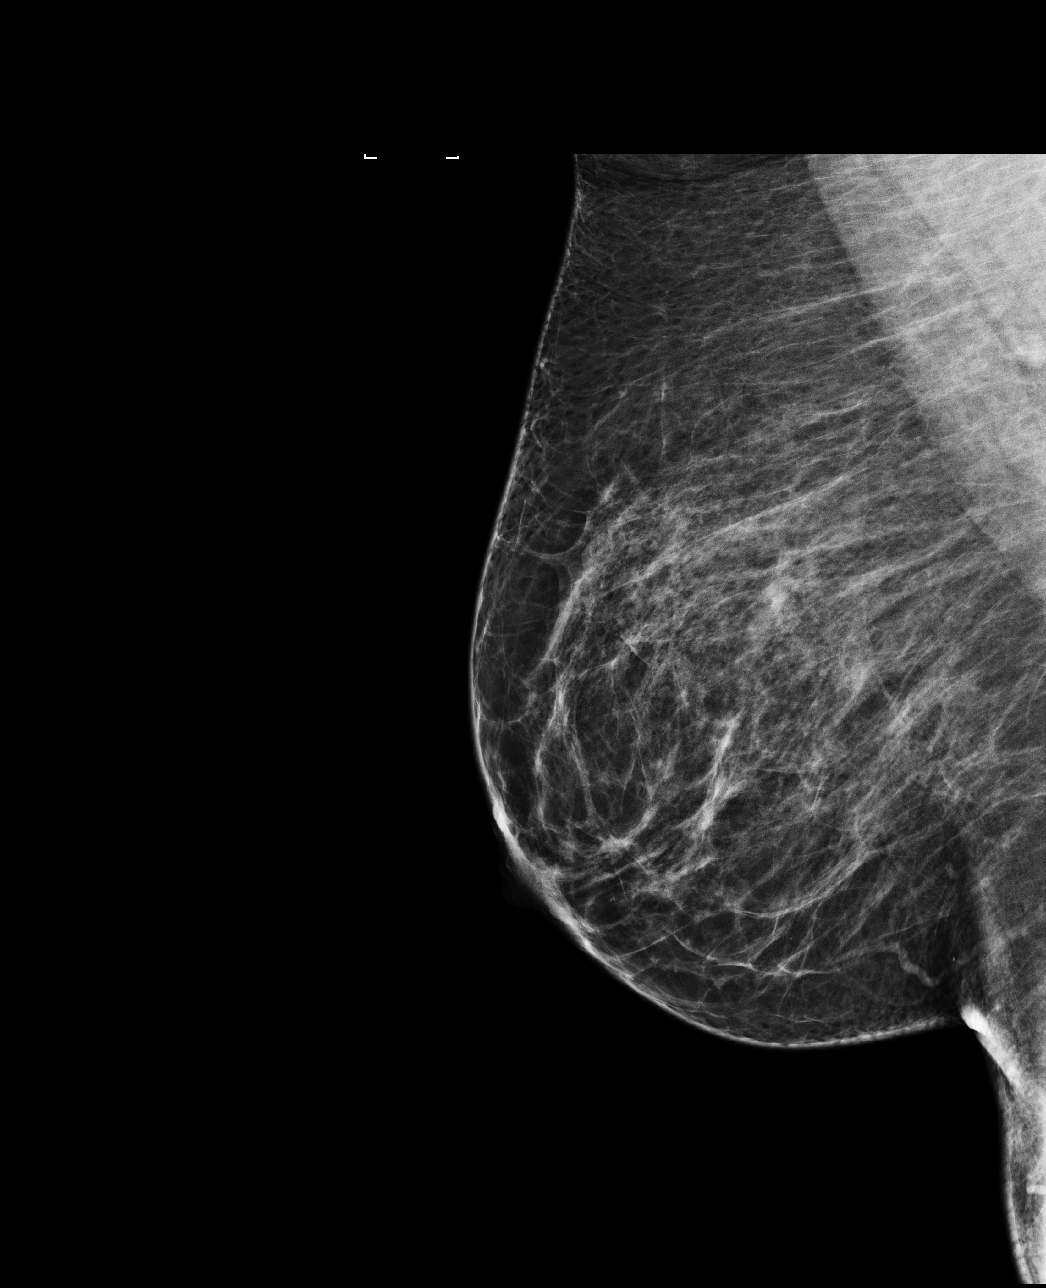

[L MLO]
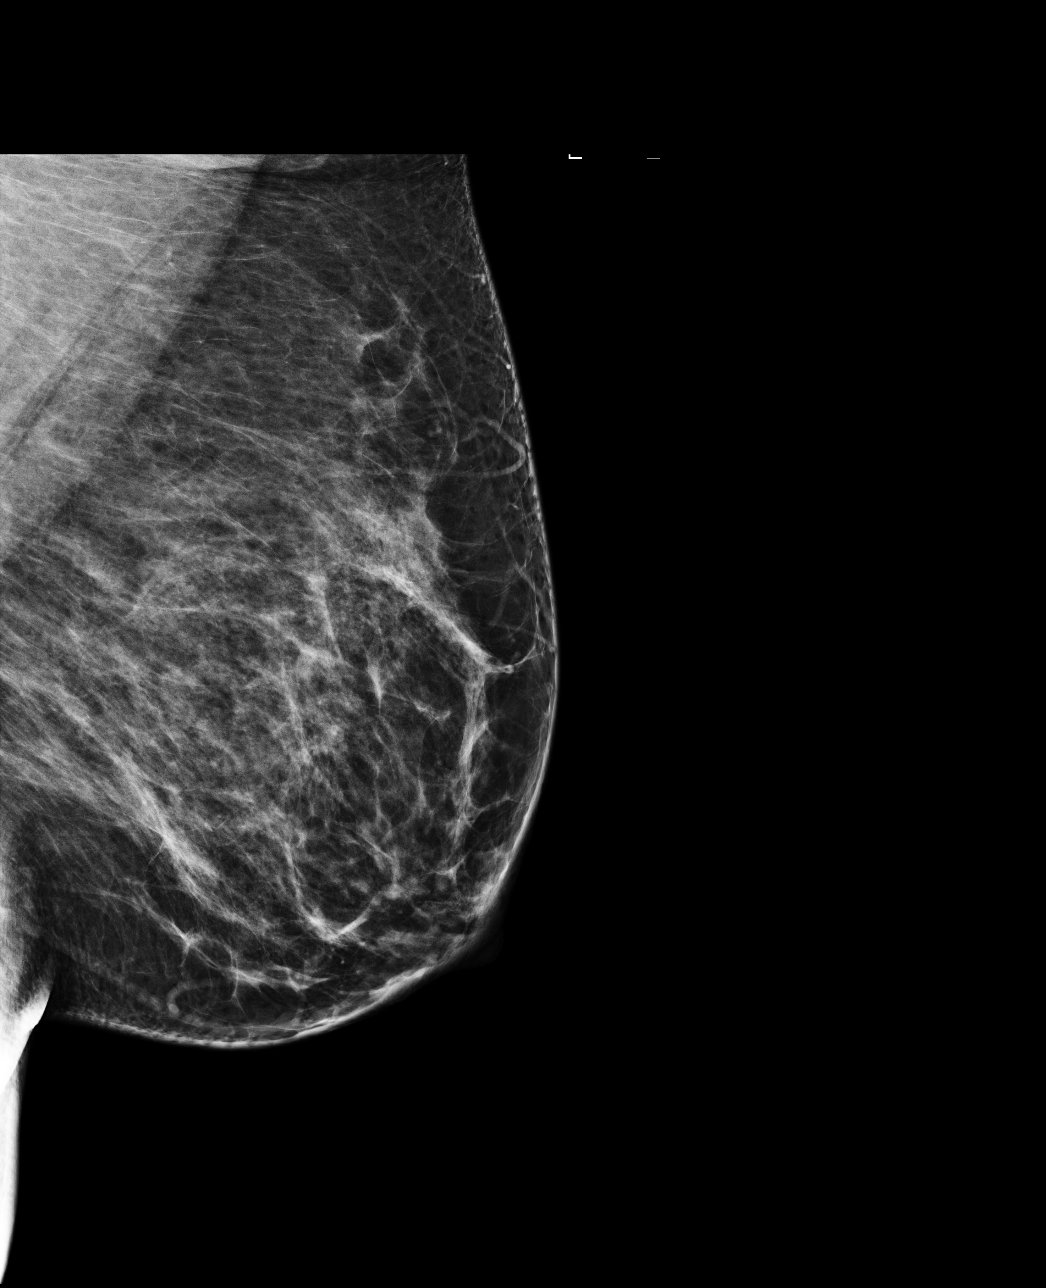

[R CC]
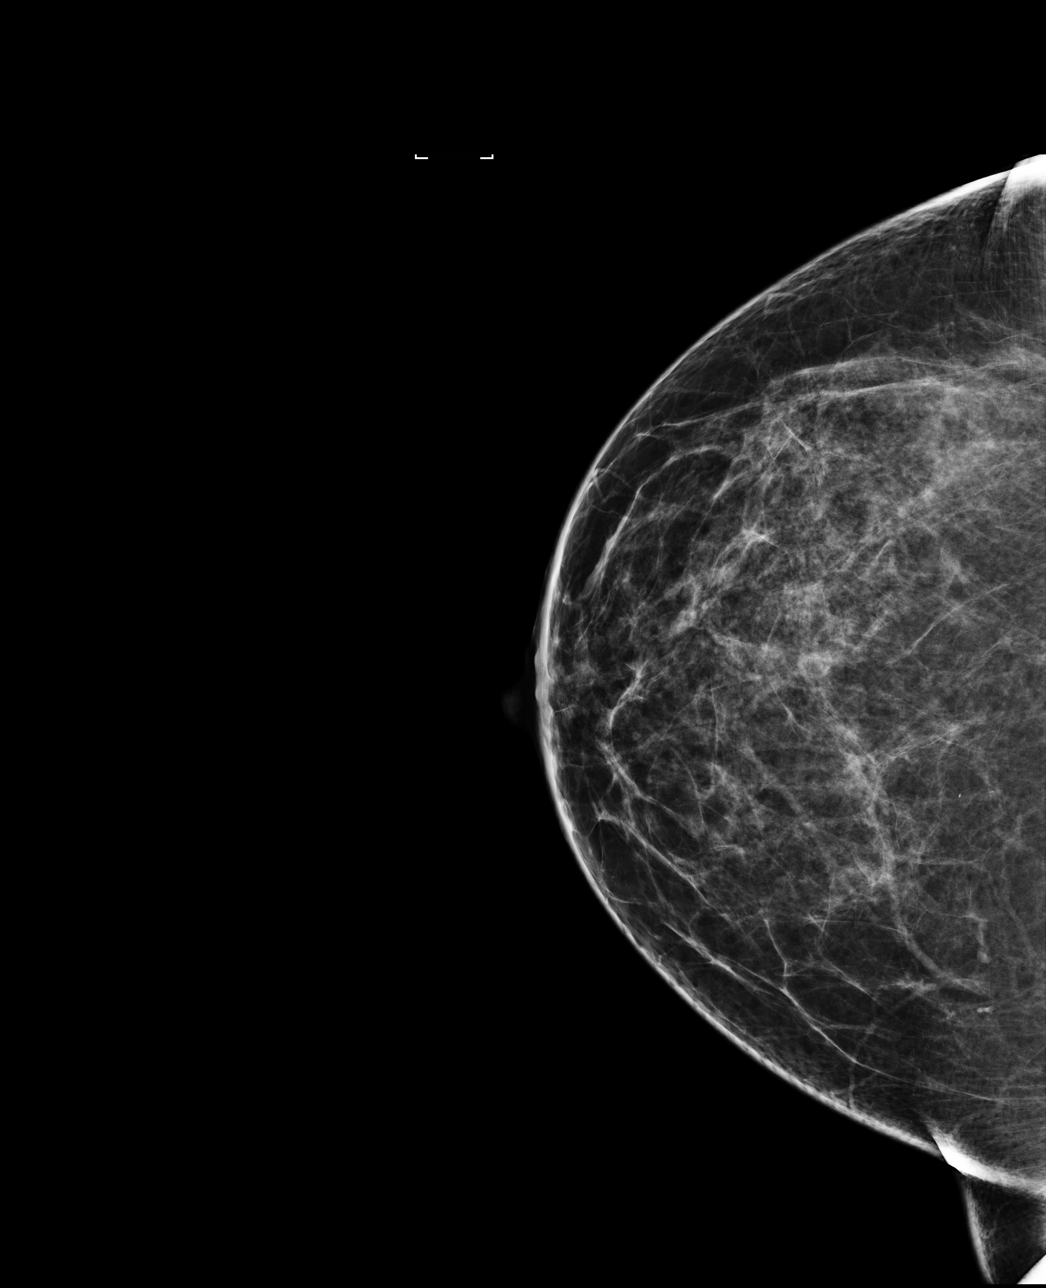

[L CC]
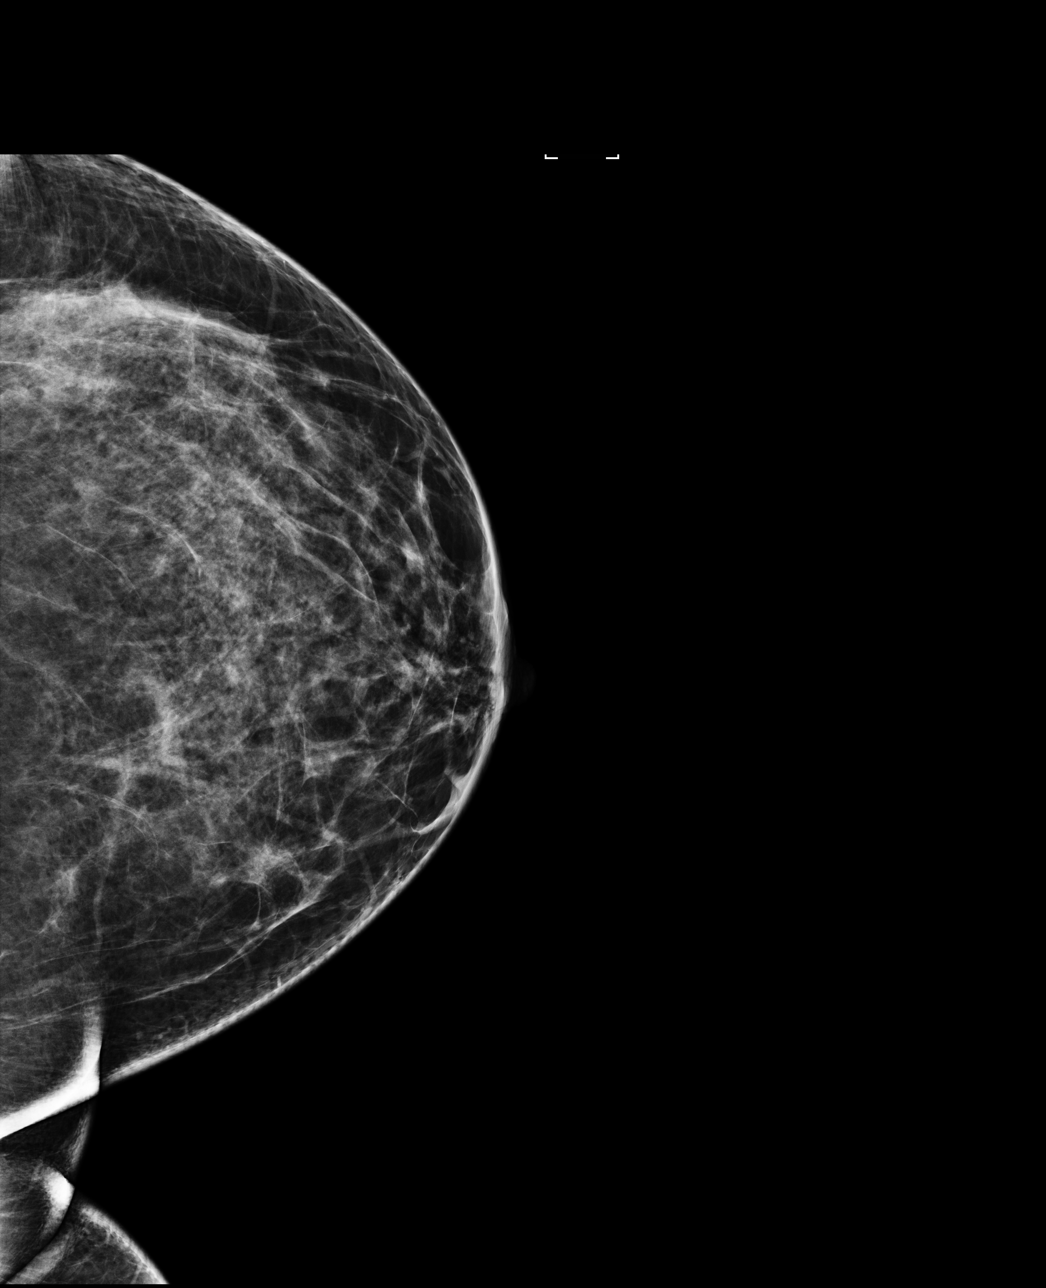

[4 of 4 positions shown; findings below may reference images not displayed]

ACR Breast Density Category b: There are scattered areas of
fibroglandular density.
FINDINGS: There are no findings suspicious for malignancy. The images were
evaluated with computer-aided detection.
IMPRESSION: No mammographic evidence of malignancy. A result letter of this
screening mammogram will be mailed directly to the patient.

RECOMMENDATION:
Screening mammogram in one year. (Code:MX-V-S3S)

BI-RADS CATEGORY  1: Negative.

## 2023-02-07 ENCOUNTER — Other Ambulatory Visit: Payer: Self-pay | Admitting: Family Medicine

## 2023-02-07 DIAGNOSIS — Z1231 Encounter for screening mammogram for malignant neoplasm of breast: Secondary | ICD-10-CM

## 2023-03-14 ENCOUNTER — Ambulatory Visit
Admission: RE | Admit: 2023-03-14 | Discharge: 2023-03-14 | Disposition: A | Discharge status: Home / Self Care | Payer: 59 | Source: Ambulatory Visit | Attending: Family Medicine | Admitting: Family Medicine

## 2023-03-14 DIAGNOSIS — Z1231 Encounter for screening mammogram for malignant neoplasm of breast: Secondary | ICD-10-CM

## 2023-08-31 ENCOUNTER — Encounter: Payer: Self-pay | Admitting: Podiatry

## 2023-08-31 ENCOUNTER — Ambulatory Visit (INDEPENDENT_AMBULATORY_CARE_PROVIDER_SITE_OTHER)

## 2023-08-31 ENCOUNTER — Ambulatory Visit: Admitting: Podiatry

## 2023-08-31 DIAGNOSIS — M722 Plantar fascial fibromatosis: Secondary | ICD-10-CM

## 2023-08-31 DIAGNOSIS — M2142 Flat foot [pes planus] (acquired), left foot: Secondary | ICD-10-CM | POA: Diagnosis not present

## 2023-08-31 DIAGNOSIS — M2141 Flat foot [pes planus] (acquired), right foot: Secondary | ICD-10-CM | POA: Diagnosis not present

## 2023-08-31 DIAGNOSIS — M2140 Flat foot [pes planus] (acquired), unspecified foot: Secondary | ICD-10-CM

## 2023-08-31 MED ORDER — METHYLPREDNISOLONE 4 MG PO TBPK: ORAL_TABLET | ORAL | 0 refills | Status: AC

## 2023-08-31 MED ORDER — MELOXICAM 15 MG PO TABS: 15.0000 mg | ORAL_TABLET | Freq: Every day | ORAL | 3 refills | Status: AC

## 2023-09-04 NOTE — Progress Notes (Unsigned)
 Subjective:  Patient ID: [name], female    DOB: December 17, 1975,  MRN: [id number] HPI Chief Complaint  Patient presents with   Foot Pain    Rm8/ Bilateral sinus tarsi and heel pain/1 year/ aching and swelling/no diabetic/no treatment    48 y.o. female presents with the above complaint.   ROS: Denies fever chills nausea mobic  muscle aches pains calf pain back pain chest pain shortness of breath.  Past Medical History:  Diagnosis Date   Anxiety    Depression 08/10/2012   Dysmenorrhea    in the past   Endometriosis    Fibroid 2008   small   Hormone disorder    Papillary thyroid  carcinoma (HCC)    Thyroid  enlarged 08/03/2012   Past Surgical History:  Procedure Laterality Date   ABLATION  2009   GYNECOLOGIC CRYOSURGERY  1997   THYROIDECTOMY     TUBAL LIGATION  2001    Current Outpatient Medications:    acetaminophen  (TYLENOL ) 325 MG tablet, Take by mouth., Disp: , Rfl:    amoxicillin  (AMOXIL ) 500 MG capsule, Take 500 mg by mouth., Disp: , Rfl:    Calcium Carb-Cholecalciferol (CALCIUM 1000 + D PO), Take by mouth., Disp: , Rfl:    ibuprofen (ADVIL) 800 MG tablet, Take 800 mg by mouth 3 (three) times daily., Disp: , Rfl:    levothyroxine (SYNTHROID, LEVOTHROID) 200 MCG tablet, Take 224 mcg by mouth daily before breakfast., Disp: , Rfl:    liothyronine (CYTOMEL) 5 MCG tablet, Take 5 mcg by mouth every morning., Disp: , Rfl:    meloxicam  (MOBIC ) 15 MG tablet, Take 1 tablet (15 mg total) by mouth daily., Disp: 30 tablet, Rfl: 3   methylPREDNISolone  (MEDROL  DOSEPAK) 4 MG TBPK tablet, 6 day dose pack - take as directed, Disp: 21 tablet, Rfl: 0   Multiple Vitamins-Minerals (MULTIVITAMIN PO), Take by mouth., Disp: , Rfl:    ondansetron (ZOFRAN-ODT) 4 MG disintegrating tablet, Take 4 mg by mouth every 8 (eight) hours as needed., Disp: , Rfl:    progesterone (PROMETRIUM) 100 MG capsule, Take 100 mg by mouth at bedtime., Disp: , Rfl:    traMADol (ULTRAM) 50 MG tablet, Take 50 mg by mouth  every 8 (eight) hours as needed., Disp: , Rfl:    venlafaxine  XR (EFFEXOR -XR) 75 MG 24 hr capsule, Take 75 mg by mouth daily., Disp: , Rfl:   Allergies  Allergen Reactions   Hydrocodone  Nausea And Vomiting   Codeine Nausea Only and Other (See Comments)   Hydrocodone -Acetaminophen  Nausea And Vomiting and Nausea Only   Review of Systems Objective:  There were no vitals filed for this visit.  General: Well developed, nourished, in no acute distress, alert and oriented x3   Dermatological: Skin is warm, dry and supple bilateral. Nails x 10 are well maintained; remaining integument appears unremarkable at this time. There are no open sores, no preulcerative lesions, no rash or signs of infection present.  Vascular: Dorsalis Pedis artery and Posterior Tibial artery pedal pulses are 2/4 bilateral with immedate capillary fill time. Pedal hair growth present. No varicosities and no lower extremity edema present bilateral.   Neruologic: Grossly intact via light touch bilateral. Vibratory intact via tuning fork bilateral. Protective threshold with Semmes Wienstein monofilament intact to all pedal sites bilateral. Patellar and Achilles deep tendon reflexes 2+ bilateral. No Babinski or clonus noted bilateral.   Musculoskeletal: No gross boney pedal deformities bilateral. No pain, crepitus, or limitation noted with foot and ankle range of motion bilateral. Muscular  strength 5/5 in all groups tested bilateral.  She has pain on palpation medial calcaneal tubercles bilateral with tenderness on palpation of the lateral aspect of the foot around the fourth fifth tarsometatarsal joint area.  No pain on medial-lateral compression of the calcaneus.  Mild flexible pes planovalgus.  Gait: Unassisted, Nonantalgic.    Radiographs:  Radiographs taken today demonstrate osseously mature individual retrocalcaneal heel spurs are noted soft tissue increase in density present at the Achilles insertion.  Assessment &  Plan:   Assessment: Plantar fasciitis, mild pes planovalgus.  Plan: Start her on methylprednisolone  to be followed by meloxicam .  Injected bilateral heels today 20 mg Kenalog 5 mg Marcaine.  Discussed appropriate shoe gear stretching exercise ice therapy and shoe gear modifications follow-up with her in 1 month     Deloma Spindle T. [place], [place]

## 2023-09-05 DIAGNOSIS — M722 Plantar fascial fibromatosis: Secondary | ICD-10-CM | POA: Diagnosis not present

## 2023-09-05 MED ORDER — TRIAMCINOLONE ACETONIDE 40 MG/ML IJ SUSP
40.0000 mg | Freq: Once | INTRAMUSCULAR | Status: AC
  Administered 2023-09-05: 40 mg

## 2023-09-28 ENCOUNTER — Ambulatory Visit: Admitting: Podiatry

## 2023-12-21 ENCOUNTER — Other Ambulatory Visit: Payer: Self-pay | Admitting: Podiatry

## 2024-01-29 ENCOUNTER — Other Ambulatory Visit: Payer: Self-pay | Admitting: Family Medicine

## 2024-01-29 DIAGNOSIS — Z1231 Encounter for screening mammogram for malignant neoplasm of breast: Secondary | ICD-10-CM

## 2024-03-15 ENCOUNTER — Ambulatory Visit
Admission: RE | Admit: 2024-03-15 | Discharge: 2024-03-15 | Disposition: A | Discharge status: Home / Self Care | Source: Ambulatory Visit | Attending: Family Medicine | Admitting: Family Medicine

## 2024-03-15 DIAGNOSIS — Z1231 Encounter for screening mammogram for malignant neoplasm of breast: Secondary | ICD-10-CM
